# Patient Record
Sex: Male | Born: 1950 | Race: White | Hispanic: No | Marital: Married | Smoking: Never smoker
Health system: Southern US, Community
[De-identification: ages and names within clinical notes are randomized; demographics above are authoritative.]

## PROBLEM LIST (undated history)

## (undated) DIAGNOSIS — E785 Hyperlipidemia, unspecified: Secondary | ICD-10-CM

## (undated) DIAGNOSIS — I5189 Other ill-defined heart diseases: Secondary | ICD-10-CM

## (undated) DIAGNOSIS — I071 Rheumatic tricuspid insufficiency: Secondary | ICD-10-CM

## (undated) DIAGNOSIS — I639 Cerebral infarction, unspecified: Secondary | ICD-10-CM

## (undated) DIAGNOSIS — I10 Essential (primary) hypertension: Secondary | ICD-10-CM

## (undated) DIAGNOSIS — F0390 Unspecified dementia without behavioral disturbance: Secondary | ICD-10-CM

## (undated) DIAGNOSIS — K219 Gastro-esophageal reflux disease without esophagitis: Secondary | ICD-10-CM

## (undated) DIAGNOSIS — G459 Transient cerebral ischemic attack, unspecified: Secondary | ICD-10-CM

## (undated) HISTORY — DX: Transient cerebral ischemic attack, unspecified: G45.9

## (undated) HISTORY — PX: COLONOSCOPY: SHX174

## (undated) HISTORY — DX: Hyperlipidemia, unspecified: E78.5

---

## 1980-10-27 HISTORY — PX: RETINAL DETACHMENT SURGERY: SHX105

## 2005-04-10 ENCOUNTER — Emergency Department: Payer: Self-pay | Admitting: Unknown Physician Specialty

## 2005-04-10 ENCOUNTER — Other Ambulatory Visit: Payer: Self-pay

## 2006-06-09 ENCOUNTER — Emergency Department: Payer: Self-pay | Admitting: Emergency Medicine

## 2006-06-09 ENCOUNTER — Other Ambulatory Visit: Payer: Self-pay

## 2006-06-10 ENCOUNTER — Ambulatory Visit: Payer: Self-pay | Admitting: Emergency Medicine

## 2007-11-27 ENCOUNTER — Emergency Department: Payer: Self-pay | Admitting: Emergency Medicine

## 2011-08-17 ENCOUNTER — Emergency Department: Payer: Self-pay | Admitting: Unknown Physician Specialty

## 2012-02-24 DIAGNOSIS — I10 Essential (primary) hypertension: Secondary | ICD-10-CM | POA: Insufficient documentation

## 2012-12-18 ENCOUNTER — Emergency Department: Payer: Self-pay | Admitting: Internal Medicine

## 2013-06-15 ENCOUNTER — Ambulatory Visit: Payer: Self-pay | Admitting: Unknown Physician Specialty

## 2015-05-16 DIAGNOSIS — Z8673 Personal history of transient ischemic attack (TIA), and cerebral infarction without residual deficits: Secondary | ICD-10-CM | POA: Insufficient documentation

## 2015-10-29 DIAGNOSIS — J0100 Acute maxillary sinusitis, unspecified: Secondary | ICD-10-CM | POA: Diagnosis not present

## 2016-05-05 DIAGNOSIS — C4441 Basal cell carcinoma of skin of scalp and neck: Secondary | ICD-10-CM | POA: Diagnosis not present

## 2016-05-05 DIAGNOSIS — D0439 Carcinoma in situ of skin of other parts of face: Secondary | ICD-10-CM | POA: Diagnosis not present

## 2016-05-05 DIAGNOSIS — D485 Neoplasm of uncertain behavior of skin: Secondary | ICD-10-CM | POA: Diagnosis not present

## 2016-05-05 DIAGNOSIS — D1801 Hemangioma of skin and subcutaneous tissue: Secondary | ICD-10-CM | POA: Diagnosis not present

## 2016-05-05 DIAGNOSIS — X32XXXA Exposure to sunlight, initial encounter: Secondary | ICD-10-CM | POA: Diagnosis not present

## 2016-05-05 DIAGNOSIS — C44311 Basal cell carcinoma of skin of nose: Secondary | ICD-10-CM | POA: Diagnosis not present

## 2016-05-05 DIAGNOSIS — L570 Actinic keratosis: Secondary | ICD-10-CM | POA: Diagnosis not present

## 2016-05-05 DIAGNOSIS — L80 Vitiligo: Secondary | ICD-10-CM | POA: Diagnosis not present

## 2016-05-16 DIAGNOSIS — L80 Vitiligo: Secondary | ICD-10-CM | POA: Diagnosis not present

## 2016-05-16 DIAGNOSIS — I10 Essential (primary) hypertension: Secondary | ICD-10-CM | POA: Diagnosis not present

## 2016-05-16 DIAGNOSIS — Z0000 Encounter for general adult medical examination without abnormal findings: Secondary | ICD-10-CM | POA: Insufficient documentation

## 2016-05-16 DIAGNOSIS — Z8673 Personal history of transient ischemic attack (TIA), and cerebral infarction without residual deficits: Secondary | ICD-10-CM | POA: Diagnosis not present

## 2016-05-19 DIAGNOSIS — Z125 Encounter for screening for malignant neoplasm of prostate: Secondary | ICD-10-CM | POA: Diagnosis not present

## 2016-05-19 DIAGNOSIS — I10 Essential (primary) hypertension: Secondary | ICD-10-CM | POA: Diagnosis not present

## 2016-05-19 DIAGNOSIS — L80 Vitiligo: Secondary | ICD-10-CM | POA: Diagnosis not present

## 2016-05-19 DIAGNOSIS — Z0000 Encounter for general adult medical examination without abnormal findings: Secondary | ICD-10-CM | POA: Diagnosis not present

## 2016-05-23 DIAGNOSIS — L905 Scar conditions and fibrosis of skin: Secondary | ICD-10-CM | POA: Diagnosis not present

## 2016-05-23 DIAGNOSIS — C4441 Basal cell carcinoma of skin of scalp and neck: Secondary | ICD-10-CM | POA: Diagnosis not present

## 2016-05-28 DIAGNOSIS — C44311 Basal cell carcinoma of skin of nose: Secondary | ICD-10-CM | POA: Diagnosis not present

## 2016-05-28 DIAGNOSIS — Z85828 Personal history of other malignant neoplasm of skin: Secondary | ICD-10-CM | POA: Diagnosis not present

## 2016-06-06 DIAGNOSIS — D044 Carcinoma in situ of skin of scalp and neck: Secondary | ICD-10-CM | POA: Diagnosis not present

## 2016-06-19 ENCOUNTER — Emergency Department
Admission: EM | Admit: 2016-06-19 | Discharge: 2016-06-19 | Disposition: A | Discharge status: Home / Self Care | Payer: PPO | Attending: Emergency Medicine | Admitting: Emergency Medicine

## 2016-06-19 ENCOUNTER — Encounter: Payer: Self-pay | Admitting: Emergency Medicine

## 2016-06-19 DIAGNOSIS — R1031 Right lower quadrant pain: Secondary | ICD-10-CM | POA: Diagnosis not present

## 2016-06-19 DIAGNOSIS — K4090 Unilateral inguinal hernia, without obstruction or gangrene, not specified as recurrent: Secondary | ICD-10-CM | POA: Diagnosis not present

## 2016-06-19 LAB — COMPREHENSIVE METABOLIC PANEL
ALK PHOS: 63 U/L (ref 38–126)
ALT: 26 U/L (ref 17–63)
AST: 25 U/L (ref 15–41)
Albumin: 4.7 g/dL (ref 3.5–5.0)
Anion gap: 9 (ref 5–15)
BILIRUBIN TOTAL: 1 mg/dL (ref 0.3–1.2)
BUN: 23 mg/dL — ABNORMAL HIGH (ref 6–20)
CALCIUM: 9.6 mg/dL (ref 8.9–10.3)
CO2: 21 mmol/L — ABNORMAL LOW (ref 22–32)
CREATININE: 1.11 mg/dL (ref 0.61–1.24)
Chloride: 111 mmol/L (ref 101–111)
Glucose, Bld: 87 mg/dL (ref 65–99)
Potassium: 4.4 mmol/L (ref 3.5–5.1)
Sodium: 141 mmol/L (ref 135–145)
TOTAL PROTEIN: 7.3 g/dL (ref 6.5–8.1)

## 2016-06-19 LAB — CBC
HCT: 44.5 % (ref 40.0–52.0)
Hemoglobin: 15.8 g/dL (ref 13.0–18.0)
MCH: 33.1 pg (ref 26.0–34.0)
MCHC: 35.6 g/dL (ref 32.0–36.0)
MCV: 93 fL (ref 80.0–100.0)
PLATELETS: 196 10*3/uL (ref 150–440)
RBC: 4.78 MIL/uL (ref 4.40–5.90)
RDW: 13.1 % (ref 11.5–14.5)
WBC: 4.8 10*3/uL (ref 3.8–10.6)

## 2016-06-19 LAB — URINALYSIS COMPLETE WITH MICROSCOPIC (ARMC ONLY)
Bacteria, UA: NONE SEEN
Bilirubin Urine: NEGATIVE
Glucose, UA: NEGATIVE mg/dL
Hgb urine dipstick: NEGATIVE
KETONES UR: NEGATIVE mg/dL
Leukocytes, UA: NEGATIVE
NITRITE: NEGATIVE
PROTEIN: NEGATIVE mg/dL
RBC / HPF: NONE SEEN RBC/hpf (ref 0–5)
SPECIFIC GRAVITY, URINE: 1.021 (ref 1.005–1.030)
Squamous Epithelial / LPF: NONE SEEN
pH: 5 (ref 5.0–8.0)

## 2016-06-19 LAB — LIPASE, BLOOD: LIPASE: 22 U/L (ref 11–51)

## 2016-06-19 MED ORDER — TRAMADOL HCL 50 MG PO TABS: 50.0000 mg | ORAL_TABLET | Freq: Four times a day (QID) | ORAL | 0 refills | Status: DC | PRN

## 2016-06-19 MED ORDER — TRAMADOL HCL 50 MG PO TABS
50.0000 mg | ORAL_TABLET | Freq: Once | ORAL | Status: AC
  Administered 2016-06-19: 50 mg via ORAL
  Filled 2016-06-19: qty 1

## 2016-06-19 NOTE — ED Triage Notes (Signed)
Pt presents from home with RLQ pain for about 3 weeks. States that today it hurts worse, with a burning quality. Denies urinary symptoms, fever, chills. Pt states pain usually goes away when he sits down, but today it is not going away.

## 2016-06-19 NOTE — ED Notes (Signed)
Lab called concerning urine sample, pt states he gave a sample in triage, lab states they will look for sample

## 2016-06-19 NOTE — ED Notes (Signed)
EDP at bedside  

## 2016-06-19 NOTE — Discharge Instructions (Signed)
You have small inguinal hernia.   Please take motrin for pain.   Take tramadol for severe pain.   No heavy lifting  Call [facility] for appointment   Return to ER if you have severe abdominal pain, fever, vomiting, not passing gas.

## 2016-06-19 NOTE — ED Provider Notes (Signed)
[facility] Provider Note   CSN: [id number] Arrival date & time: 06/19/16  1654     History   Chief Complaint Chief Complaint  Patient presents with  . Abdominal Pain    HPI Todd Williamson is a 65 y.o. male here presenting with right-sided abdominal pain. Patient states that he has been having intermittent right inguinal area pain for the last 3 weeks. Patient states that it's worse when he exerts himself and carried something heavy. States that it's better when he sits down but he does feel more pain when he stands up. Denies any fever or vomiting or urinary symptoms. Rates that he is passing gas and had normal bowel movement. Denies any previous abdominal surgeries.  The history is provided by the patient.    History reviewed. No pertinent past medical history.  There are no active problems to display for this patient.   History reviewed. No pertinent surgical history.     Home Medications    Prior to Admission medications   Not on File    Family History History reviewed. No pertinent family history.  Social History Social History  Substance Use Topics  . Smoking status: Never Smoker  . Smokeless tobacco: Never Used  . Alcohol use Yes     Comment: occasionally     Allergies   Review of patient's allergies indicates no known allergies.   Review of Systems Review of Systems  Gastrointestinal: Positive for abdominal pain.  All other systems reviewed and are negative.    Physical Exam Updated Vital Signs BP 135/82 (BP Location: Right Arm)   Pulse 65   Temp 97.7 F (36.5 C) (Oral)   Resp 18   Ht 6\' 1"  (1.854 m)   Wt 195 lb (88.5 kg)   SpO2 95%   BMI 25.73 kg/m   Physical Exam  Constitutional: He is oriented to person, place, and time. He appears well-developed and well-nourished.  HENT:  Head: Normocephalic.  Eyes: Pupils are equal, round, and reactive to light.  Neck: Normal range of motion.  Cardiovascular: Normal rate,  regular rhythm and normal heart sounds.   Pulmonary/Chest: Effort normal and breath sounds normal.  Abdominal: Soft. Bowel sounds are normal.  ? Small indirect inguinal hernia when coughing, no signs of incarceration. Easily reducible   Genitourinary:  Genitourinary Comments: No testicular tenderness, good cremasteric reflex   Musculoskeletal: Normal range of motion.  Neurological: He is alert and oriented to person, place, and time.  Skin: Skin is warm.  Nursing note and vitals reviewed.    ED Treatments / Results  Labs (all labs ordered are listed, but only abnormal results are displayed) Labs Reviewed  COMPREHENSIVE METABOLIC PANEL - Abnormal; Notable for the following:       Result Value   CO2 21 (*)    BUN 23 (*)    All other components within normal limits  URINALYSIS COMPLETEWITH MICROSCOPIC ([facility] ONLY) - Abnormal; Notable for the following:    Color, Urine YELLOW (*)    APPearance CLEAR (*)    All other components within normal limits  LIPASE, BLOOD  CBC    EKG  EKG Interpretation None       Radiology No results found.  Procedures Procedures (including critical care time)  Medications Ordered in ED Medications - No data to display   Initial Impression / Assessment and Plan / ED Course  I have reviewed the triage vital signs and the nursing notes.  Pertinent labs & imaging results that  were available during my care of the patient were reviewed by me and considered in my medical decision making (see chart for details).  Clinical Course   Todd Williamson is a 65 y.o. male here with small R inguinal hernia. Easily reducible. Labs unremarkable. I called Dr. Burt Knack from surgery, who wants patient to call office for appointment. Will dc home with tramadol, motrin    Final Clinical Impressions(s) / ED Diagnoses   Final diagnoses:  None    New Prescriptions New Prescriptions   No medications on file     Drenda Freeze, MD 06/19/16 1912

## 2016-06-26 ENCOUNTER — Other Ambulatory Visit: Payer: Self-pay

## 2016-06-27 ENCOUNTER — Telehealth: Payer: Self-pay | Admitting: Surgery

## 2016-06-27 NOTE — Telephone Encounter (Signed)
Spoke with patient at this time. He stated he was in a lot of pain yesterday however today it hasn't been that bad today.  He denies fever, chills, pain or diarrhea or constipation at this time. He was instructed that if any of the above develop to go to the emergency room, otherwise we will see him 07/02/16.

## 2016-06-27 NOTE — Telephone Encounter (Signed)
Patient was seen in the ER on 06/19/16 for inguinal hernia and consulted with Dr Burt Knack in the ER. An appointment was made for 07/07/16 with Dr Dahlia Byes. Patient is calling today because he is in a lot of pain. Please triage to determine if patient should return to the ER, and/or should patient see Dr Burt Knack this coming week on 07/02/16, or is it ok to wait until 9/11? Thank you.

## 2016-07-07 ENCOUNTER — Ambulatory Visit (INDEPENDENT_AMBULATORY_CARE_PROVIDER_SITE_OTHER): Payer: PPO | Admitting: Surgery

## 2016-07-07 ENCOUNTER — Encounter: Payer: Self-pay | Admitting: Surgery

## 2016-07-07 VITALS — BP 145/93 | HR 65 | Temp 98.0°F | Ht 73.0 in | Wt 200.0 lb

## 2016-07-07 DIAGNOSIS — K4090 Unilateral inguinal hernia, without obstruction or gangrene, not specified as recurrent: Secondary | ICD-10-CM

## 2016-07-07 DIAGNOSIS — G459 Transient cerebral ischemic attack, unspecified: Secondary | ICD-10-CM | POA: Insufficient documentation

## 2016-07-07 NOTE — Progress Notes (Signed)
We have scheduled your surgery for 07/24/16 at [facility]. Please see your [facility] pre-care sheet. Please call our office if you have any questions or concerns.

## 2016-07-07 NOTE — Progress Notes (Signed)
Patient ID: Todd Williamson, male   DOB: 10/05/1951, 65 y.o.   MRN: [id number]  HPI Todd Williamson is a 65 y.o. male obtained for a right inguinal hernia. He reports that he first noticed some intermittent pain on the right groin about 2 weeks ago. Patient reports that his pain is sharp, intermittent and non-radiated. He denies any previous hernia repairs. 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 He does report that his symptoms exacerbate and he does any Valsalva maneuvers. He takes daily aspirin. CBC as well as CMP were normal  HPI  Past Medical History:  Diagnosis Date  . TIA (transient ischemic attack)     Past Surgical History:  Procedure Laterality Date  . RETINAL DETACHMENT SURGERY Right 1982    Family History  Problem Relation Age of Onset  . Stroke Mother   . Heart disease Father   . Prostate cancer Brother   . Bone cancer Brother   . Heart disease Brother     Social History Social History  Substance Use Topics  . Smoking status: Never Smoker  . Smokeless tobacco: Never Used  . Alcohol use Yes     Comment: occasionally    Allergies  Allergen Reactions  . Hydrochlorothiazide Nausea And Vomiting    Current Outpatient Prescriptions  Medication Sig Dispense Refill  . amLODipine-benazepril (LOTREL) 10-20 MG capsule Take by mouth.    Marland Kitchen aspirin EC 325 MG tablet Take by mouth.    Marland Kitchen azelastine (ASTELIN) 0.1 % nasal spray Place into the nose.     No current facility-administered medications for this visit.      Review of Systems A 10 point review of systems was asked and was negative except for the information on the HPI  Physical Exam Blood pressure (!) 145/93, pulse 65, temperature 98 F (36.7 C), temperature source Oral, height 6\' 1"  (1.854 m), weight 90.7 kg (200 lb). CONSTITUTIONAL: NAD EYES: Pupils are equal, round, and reactive to light, Sclera are non-icteric. EARS, NOSE, MOUTH AND  THROAT: The oropharynx is clear. The oral mucosa is pink and moist. Hearing is intact to voice. LYMPH NODES:  Lymph nodes in the neck are normal. RESPIRATORY:  Lungs are clear. There is normal respiratory effort, with equal breath sounds bilaterally, and without pathologic use of accessory muscles. CARDIOVASCULAR: Heart is regular without murmurs, gallops, or rubs. GI: The abdomen is  soft, nontender, and nondistended. There are no palpable masses.  There is a reducible right inguinal hernia.  GU: No testicular masses or tenderness, no penile lesions.  MUSCULOSKELETAL: Normal muscle strength and tone. No cyanosis or edema.   SKIN: Turgor is good and there are no pathologic skin lesions or ulcers. NEUROLOGIC: Motor and sensation is grossly normal. Cranial nerves are grossly intact. PSYCH:  Oriented to person, place and time. Affect is normal.  Data Reviewed I have personally reviewed the patient's imaging, laboratory findings and medical records.    Assessment/ Plan Symptomatic right inguinal hernia. Discussed with the patient in detail given his symptoms I do recommend repair. Discussed with him in detail about options of open versus laparoscopic. He wishes to have a laparoscopic repair I have explained the procedure, risks, and aftercare of inguinal hernia repair to [employer].   Risks include but are not limited to bleeding, infection, wound problems, anesthesia, recurrence, bladder or intestine injury, urinary retention, testicular dysfunction, chronic pain, mesh problems.  He  seems to understand and agrees to proceed.  Questions were answered to his stated satisfaction. We'll plan for laparoscopic right inguinal hernia repair in a couple weeks   Caroleen Hamman, MD FACS General Surgeon 07/07/2016, 3:22 PM

## 2016-07-09 ENCOUNTER — Telehealth: Payer: Self-pay | Admitting: Surgery

## 2016-07-09 NOTE — Telephone Encounter (Signed)
I have called patient, no answer--to go over sx information below. Please document if patient calls back and is given information.    Sx date: 07/24/16 With Dr Pabon--Laparoscopic Right Inguinal Hernia repair.  Pre op date: 07/14/16 at 11:30am--Office.   Patient made aware to call [phone], between 1-3:00pm the day before surgery, to find out what time to arrive.

## 2016-07-13 DIAGNOSIS — H9203 Otalgia, bilateral: Secondary | ICD-10-CM | POA: Diagnosis not present

## 2016-07-13 DIAGNOSIS — J309 Allergic rhinitis, unspecified: Secondary | ICD-10-CM | POA: Diagnosis not present

## 2016-07-14 ENCOUNTER — Encounter
Admission: RE | Admit: 2016-07-14 | Discharge: 2016-07-14 | Disposition: A | Discharge status: Home / Self Care | Payer: PPO | Source: Ambulatory Visit | Attending: Surgery | Admitting: Surgery

## 2016-07-14 DIAGNOSIS — I10 Essential (primary) hypertension: Secondary | ICD-10-CM | POA: Insufficient documentation

## 2016-07-14 DIAGNOSIS — Z01818 Encounter for other preprocedural examination: Secondary | ICD-10-CM | POA: Insufficient documentation

## 2016-07-14 HISTORY — DX: Essential (primary) hypertension: I10

## 2016-07-14 HISTORY — DX: Gastro-esophageal reflux disease without esophagitis: K21.9

## 2016-07-14 NOTE — Patient Instructions (Signed)
Your procedure is scheduled on: Thursday 07/24/16 Report to Day Surgery. 2ND FLOOR MEDICAL MALL ENTRANCE To find out your arrival time please call [phone] between 1PM - 3PM on Wednesday 07/23/16.  Remember: Instructions that are not followed completely may result in serious medical risk, up to and including death, or upon the discretion of your surgeon and anesthesiologist your surgery may need to be rescheduled.    __X__ 1. Do not eat food or drink liquids after midnight. No gum chewing or hard candies.     __X__ 2. No Alcohol for 24 hours before or after surgery.   ____ 3. Bring all medications with you on the day of surgery if instructed.    __X__ 4. Notify your doctor if there is any change in your medical condition     (cold, fever, infections).     Do not wear jewelry, make-up, hairpins, clips or nail polish.  Do not wear lotions, powders, or perfumes.   Do not shave 48 hours prior to surgery. Men may shave face and neck.  Do not bring valuables to the hospital.    [facility] is not responsible for any belongings or valuables.               Contacts, dentures or bridgework may not be worn into surgery.  Leave your suitcase in the car. After surgery it may be brought to your room.  For patients admitted to the hospital, discharge time is determined by your                treatment team.   Patients discharged the day of surgery will not be allowed to drive home.   Please read over the following fact sheets that you were given:   Surgical Site Infection Prevention   __X__ Take these medicines the morning of surgery with A SIP OF WATER:    1. AMLODIPINE/BENAZEPRIL  2. OMEPRAZOLE  3.   4.  5.  6.  ____ Fleet Enema (as directed)   __X__ Use CHG Soap as directed  ____ Use inhalers on the day of surgery  ____ Stop metformin 2 days prior to surgery    ____ Take 1/2 of usual insulin dose the night before surgery and none on the morning of surgery.   __X__ Stop  Coumadin/Plavix/aspirin on 07/19/16  ____ Stop Anti-inflammatories on    ____ Stop supplements until after surgery.    ____ Bring C-Pap to the hospital.

## 2016-07-14 NOTE — Telephone Encounter (Signed)
Patient has received all surgery information in previous note. Patient understands all information.

## 2016-07-24 ENCOUNTER — Encounter: Admission: RE | Payer: Self-pay | Source: Ambulatory Visit

## 2016-07-24 ENCOUNTER — Ambulatory Visit: Admission: RE | Admit: 2016-07-24 | Payer: PPO | Source: Ambulatory Visit | Admitting: Surgery

## 2016-07-24 SURGERY — REPAIR, HERNIA, INGUINAL, LAPAROSCOPIC
Anesthesia: General | Laterality: Right

## 2016-07-25 ENCOUNTER — Telehealth: Payer: Self-pay | Admitting: Surgery

## 2016-07-25 NOTE — Telephone Encounter (Signed)
Patient's wife has called and stated that the patient is doing well with the sling he was provided for his hernia. She states that he did not want to reschedule surgery at this time due to his work. This is the busy season for work for the patient. She states that he does eventually want to reschedule later at the end of the year. I have advised her that the patient will require a follow up back in the office prior to scheduling surgery and she was very understanding of this process.

## 2016-09-08 DIAGNOSIS — L821 Other seborrheic keratosis: Secondary | ICD-10-CM | POA: Diagnosis not present

## 2016-09-08 DIAGNOSIS — Z85828 Personal history of other malignant neoplasm of skin: Secondary | ICD-10-CM | POA: Diagnosis not present

## 2016-09-08 DIAGNOSIS — Z08 Encounter for follow-up examination after completed treatment for malignant neoplasm: Secondary | ICD-10-CM | POA: Diagnosis not present

## 2016-09-08 DIAGNOSIS — D1801 Hemangioma of skin and subcutaneous tissue: Secondary | ICD-10-CM | POA: Diagnosis not present

## 2016-10-27 DIAGNOSIS — G459 Transient cerebral ischemic attack, unspecified: Secondary | ICD-10-CM

## 2016-10-27 HISTORY — DX: Transient cerebral ischemic attack, unspecified: G45.9

## 2016-12-12 ENCOUNTER — Other Ambulatory Visit: Payer: Self-pay | Admitting: Physician Assistant

## 2016-12-12 ENCOUNTER — Ambulatory Visit
Admission: RE | Admit: 2016-12-12 | Discharge: 2016-12-12 | Disposition: A | Discharge status: Home / Self Care | Payer: PPO | Source: Ambulatory Visit | Attending: Physician Assistant | Admitting: Physician Assistant

## 2016-12-12 DIAGNOSIS — G459 Transient cerebral ischemic attack, unspecified: Secondary | ICD-10-CM | POA: Insufficient documentation

## 2016-12-12 DIAGNOSIS — R202 Paresthesia of skin: Secondary | ICD-10-CM | POA: Diagnosis not present

## 2016-12-12 DIAGNOSIS — R531 Weakness: Secondary | ICD-10-CM | POA: Diagnosis not present

## 2016-12-12 DIAGNOSIS — R42 Dizziness and giddiness: Secondary | ICD-10-CM | POA: Diagnosis not present

## 2016-12-12 DIAGNOSIS — R51 Headache: Secondary | ICD-10-CM | POA: Diagnosis not present

## 2016-12-12 MED ORDER — IOPAMIDOL (ISOVUE-370) INJECTION 76%
75.0000 mL | Freq: Once | INTRAVENOUS | Status: AC | PRN
  Administered 2016-12-12: 75 mL via INTRAVENOUS

## 2017-01-02 DIAGNOSIS — G459 Transient cerebral ischemic attack, unspecified: Secondary | ICD-10-CM | POA: Diagnosis not present

## 2017-01-02 DIAGNOSIS — I6523 Occlusion and stenosis of bilateral carotid arteries: Secondary | ICD-10-CM | POA: Diagnosis not present

## 2017-01-09 DIAGNOSIS — I10 Essential (primary) hypertension: Secondary | ICD-10-CM | POA: Diagnosis not present

## 2017-01-09 DIAGNOSIS — R42 Dizziness and giddiness: Secondary | ICD-10-CM | POA: Diagnosis not present

## 2017-01-09 DIAGNOSIS — Z8673 Personal history of transient ischemic attack (TIA), and cerebral infarction without residual deficits: Secondary | ICD-10-CM | POA: Diagnosis not present

## 2017-01-27 DIAGNOSIS — R42 Dizziness and giddiness: Secondary | ICD-10-CM | POA: Diagnosis not present

## 2017-01-27 DIAGNOSIS — G458 Other transient cerebral ischemic attacks and related syndromes: Secondary | ICD-10-CM | POA: Diagnosis not present

## 2017-01-28 DIAGNOSIS — R42 Dizziness and giddiness: Secondary | ICD-10-CM | POA: Diagnosis not present

## 2017-03-14 DIAGNOSIS — R29818 Other symptoms and signs involving the nervous system: Secondary | ICD-10-CM | POA: Diagnosis not present

## 2017-04-15 DIAGNOSIS — Z8673 Personal history of transient ischemic attack (TIA), and cerebral infarction without residual deficits: Secondary | ICD-10-CM | POA: Diagnosis not present

## 2017-04-15 DIAGNOSIS — I10 Essential (primary) hypertension: Secondary | ICD-10-CM | POA: Diagnosis not present

## 2017-05-04 DIAGNOSIS — Z8673 Personal history of transient ischemic attack (TIA), and cerebral infarction without residual deficits: Secondary | ICD-10-CM | POA: Diagnosis not present

## 2017-05-11 DIAGNOSIS — Z85828 Personal history of other malignant neoplasm of skin: Secondary | ICD-10-CM | POA: Diagnosis not present

## 2017-05-11 DIAGNOSIS — L570 Actinic keratosis: Secondary | ICD-10-CM | POA: Diagnosis not present

## 2017-05-11 DIAGNOSIS — L80 Vitiligo: Secondary | ICD-10-CM | POA: Diagnosis not present

## 2017-05-11 DIAGNOSIS — D2262 Melanocytic nevi of left upper limb, including shoulder: Secondary | ICD-10-CM | POA: Diagnosis not present

## 2017-05-11 DIAGNOSIS — D225 Melanocytic nevi of trunk: Secondary | ICD-10-CM | POA: Diagnosis not present

## 2017-05-11 DIAGNOSIS — X32XXXA Exposure to sunlight, initial encounter: Secondary | ICD-10-CM | POA: Diagnosis not present

## 2017-05-19 DIAGNOSIS — I10 Essential (primary) hypertension: Secondary | ICD-10-CM | POA: Diagnosis not present

## 2017-05-19 DIAGNOSIS — G459 Transient cerebral ischemic attack, unspecified: Secondary | ICD-10-CM | POA: Diagnosis not present

## 2017-05-19 DIAGNOSIS — Z0000 Encounter for general adult medical examination without abnormal findings: Secondary | ICD-10-CM | POA: Diagnosis not present

## 2017-05-19 DIAGNOSIS — Z8673 Personal history of transient ischemic attack (TIA), and cerebral infarction without residual deficits: Secondary | ICD-10-CM | POA: Diagnosis not present

## 2017-11-04 DIAGNOSIS — Z8673 Personal history of transient ischemic attack (TIA), and cerebral infarction without residual deficits: Secondary | ICD-10-CM | POA: Diagnosis not present

## 2017-11-10 DIAGNOSIS — I10 Essential (primary) hypertension: Secondary | ICD-10-CM | POA: Diagnosis not present

## 2017-11-10 DIAGNOSIS — G459 Transient cerebral ischemic attack, unspecified: Secondary | ICD-10-CM | POA: Diagnosis not present

## 2017-11-10 DIAGNOSIS — Z8673 Personal history of transient ischemic attack (TIA), and cerebral infarction without residual deficits: Secondary | ICD-10-CM | POA: Diagnosis not present

## 2017-11-10 DIAGNOSIS — Z0000 Encounter for general adult medical examination without abnormal findings: Secondary | ICD-10-CM | POA: Diagnosis not present

## 2017-11-17 DIAGNOSIS — Z125 Encounter for screening for malignant neoplasm of prostate: Secondary | ICD-10-CM | POA: Diagnosis not present

## 2017-11-17 DIAGNOSIS — Z8673 Personal history of transient ischemic attack (TIA), and cerebral infarction without residual deficits: Secondary | ICD-10-CM | POA: Diagnosis not present

## 2017-11-17 DIAGNOSIS — I10 Essential (primary) hypertension: Secondary | ICD-10-CM | POA: Diagnosis not present

## 2017-11-17 DIAGNOSIS — E7800 Pure hypercholesterolemia, unspecified: Secondary | ICD-10-CM | POA: Diagnosis not present

## 2017-11-19 DIAGNOSIS — H2512 Age-related nuclear cataract, left eye: Secondary | ICD-10-CM | POA: Diagnosis not present

## 2018-01-11 IMAGING — CT CT ANGIO HEAD
3 of 11 series · 17 of 47 positions shown · IV contrast (APPLIED)
Comparison: MR brain 06/10/2006.

CLINICAL DATA: Weakness, dizziness, and headache 2 days ago. Severe
weakness in RIGHT leg. Denies injury and fall.

EXAM:
CT ANGIOGRAPHY HEAD
TECHNIQUE: Multidetector CT imaging of the head was performed using the
standard protocol during bolus administration of intravenous
contrast. Multiplanar CT image reconstructions and MIPs were
obtained to evaluate the vascular anatomy.
CONTRAST:  75 mL Isovue 370.

[Series 8: ax thin · axial · 0.39mm/px · z∈[-56,+85]mm · 12 of 165 slices shown]
[im 12/165  brain]
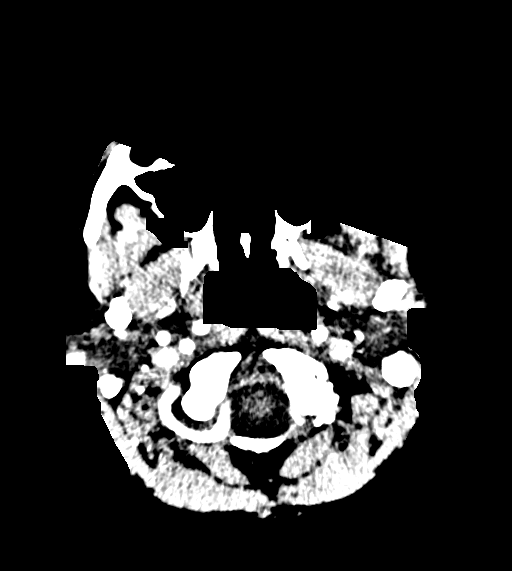
[im 24/165  bone]
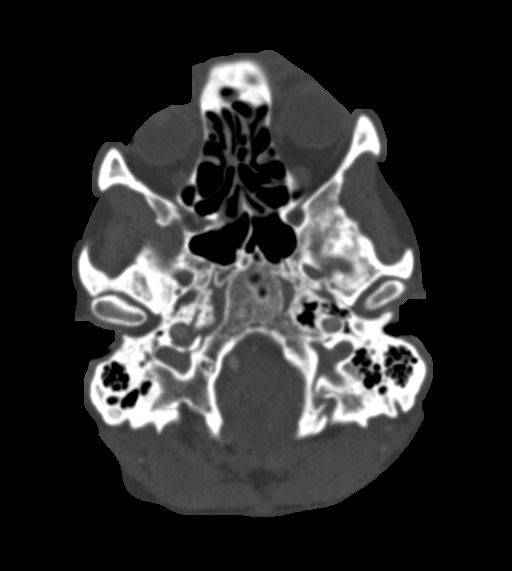
[im 36/165  brain]
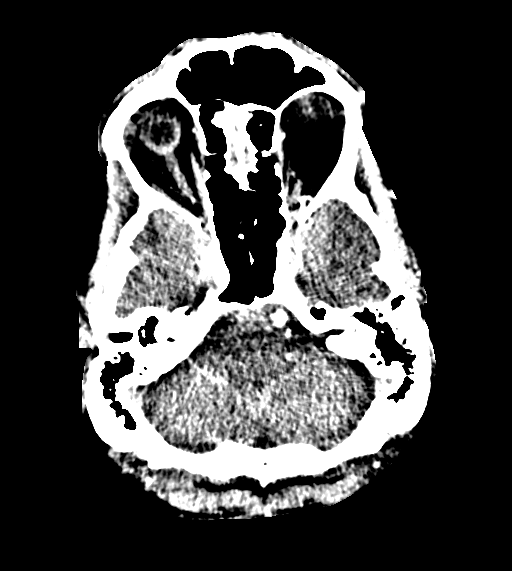
[im 47/165  bone]
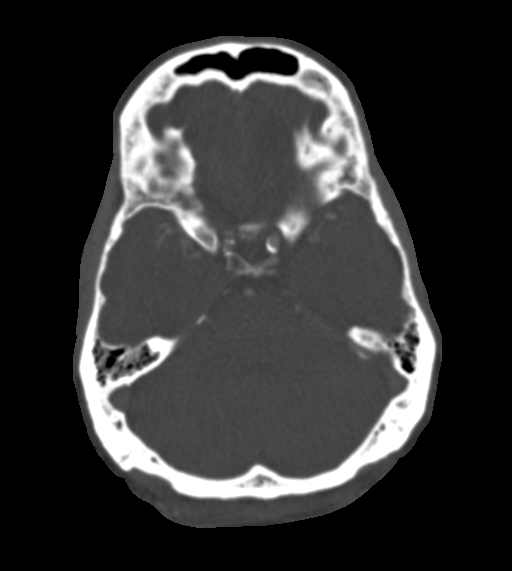
[im 59/165  brain]
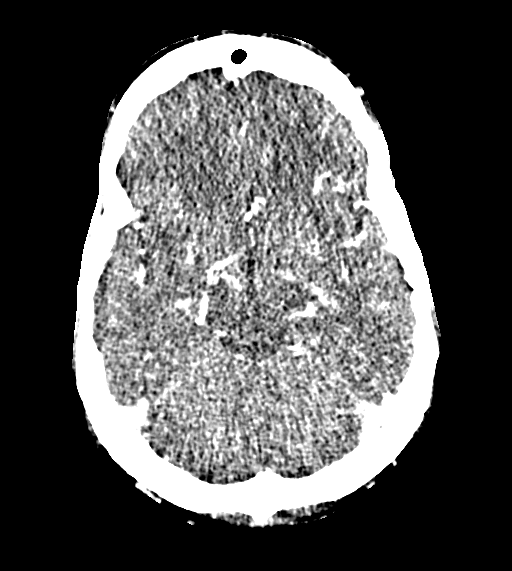
[im 71/165  bone]
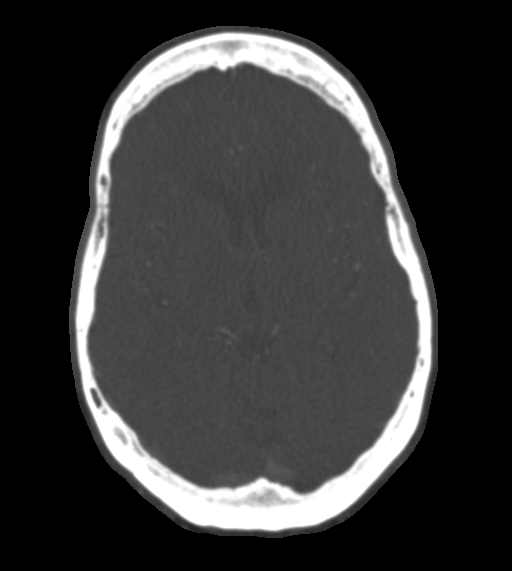
[im 94/165  brain]
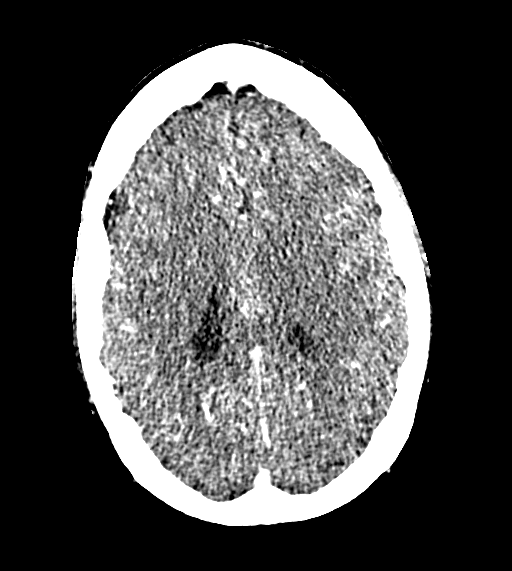
[im 106/165  bone]
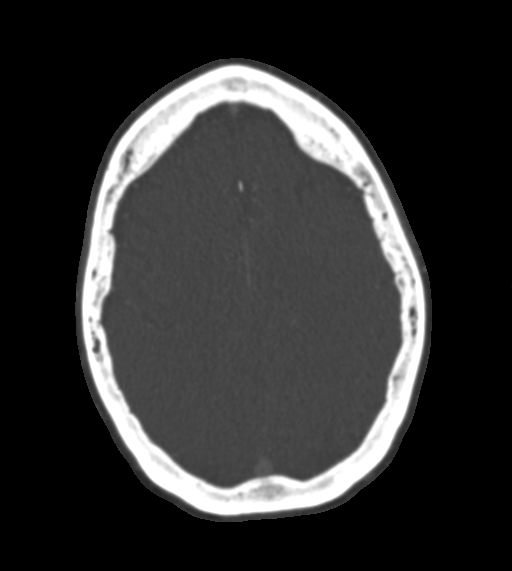
[im 118/165  brain]
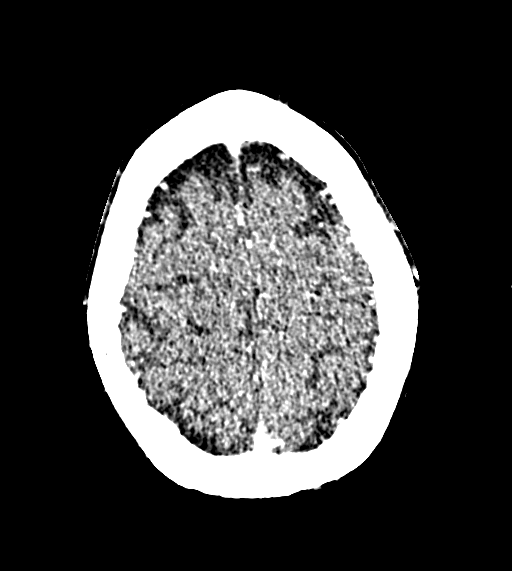
[im 129/165  bone]
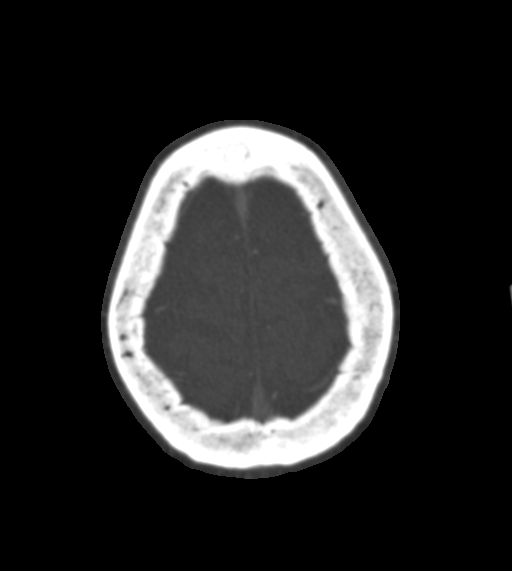
[im 141/165  brain]
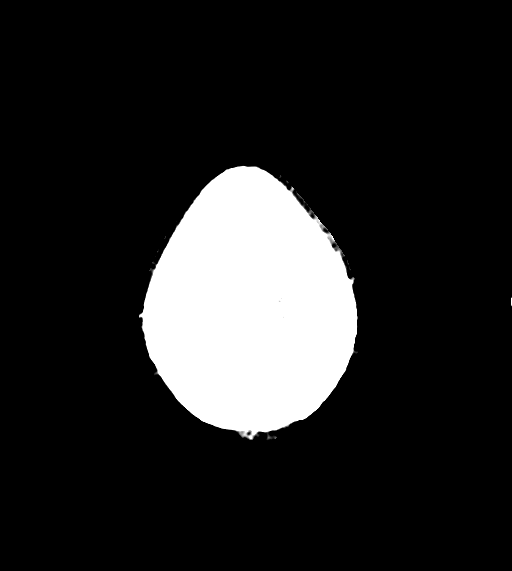
[im 153/165  bone]
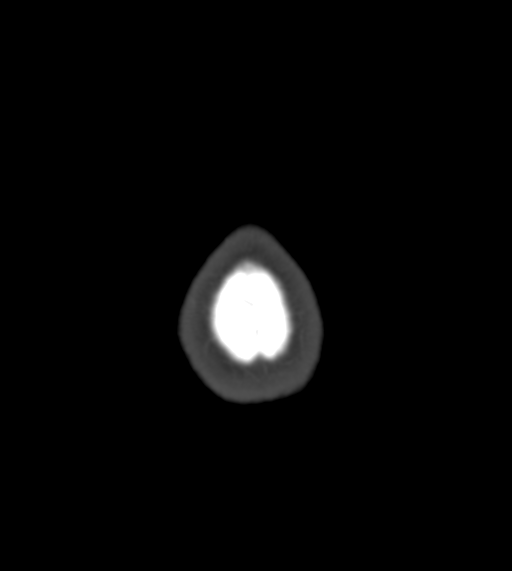

[Series 10: cor thin · coronal · 0.34mm/px · 3 of 221 slices shown]
[im 74/221  brain]
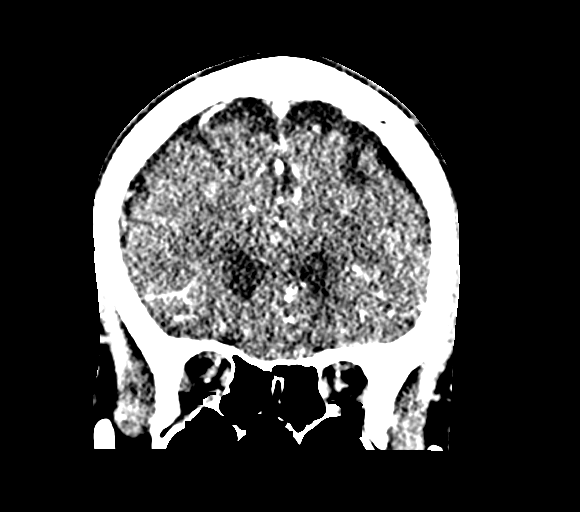
[im 111/221  brain]
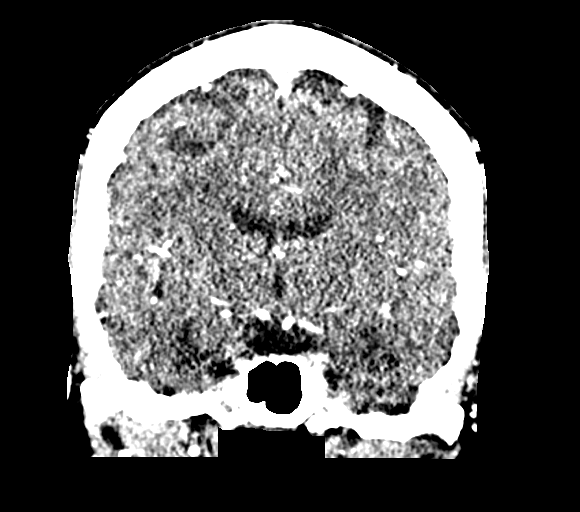
[im 147/221  brain]
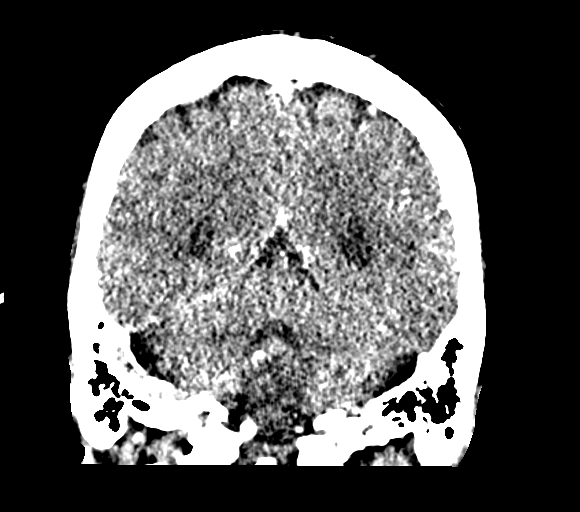

[Series 12: sag thin · sagittal · 0.37mm/px · 2 of 174 slices shown]
[im 58/174  brain]
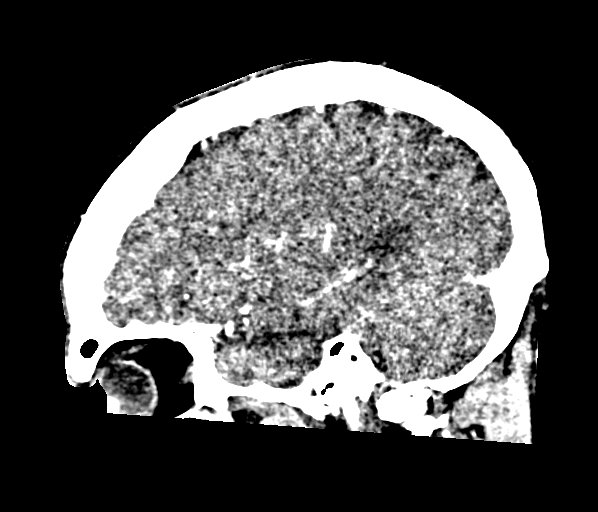
[im 116/174  brain]
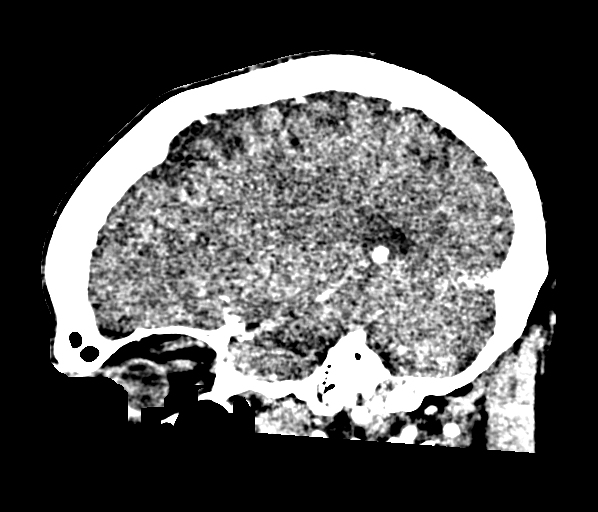

[17 of 47 positions shown; findings below may reference images not displayed]

FINDINGS: CT HEAD

Calvarium and skull base: No fracture or destructive lesion.
Mastoids and middle ears are clear.

Paranasal sinuses: Imaged portions are clear.

Orbits: Negative.

Brain: No evidence of acute abnormality, including acute infarct,
hemorrhage, hydrocephalus, or mass lesion.

CTA HEAD

Anterior circulation: Minor calcific atheromatous change in the
carotid siphons. No significant stenosis, proximal occlusion,
aneurysm, or vascular malformation.

Posterior circulation: RIGHT vertebral dominant. LEFT vertebral
primarily contributes to PICA. No significant stenosis, proximal
occlusion, aneurysm, or vascular malformation.

Venous sinuses: As permitted by contrast timing, patent.

Anatomic variants: Fetal origin LEFT PCA.

Delayed phase:   No abnormal intracranial enhancement.

Review of the MIP images confirms the above findings.
IMPRESSION: Minor atheromatous change. No proximal flow reducing lesion or acute
intracranial abnormality.

## 2018-01-18 DIAGNOSIS — S30861A Insect bite (nonvenomous) of abdominal wall, initial encounter: Secondary | ICD-10-CM | POA: Diagnosis not present

## 2018-01-18 DIAGNOSIS — W57XXXA Bitten or stung by nonvenomous insect and other nonvenomous arthropods, initial encounter: Secondary | ICD-10-CM | POA: Diagnosis not present

## 2018-05-10 DIAGNOSIS — Z85828 Personal history of other malignant neoplasm of skin: Secondary | ICD-10-CM | POA: Diagnosis not present

## 2018-05-10 DIAGNOSIS — D2261 Melanocytic nevi of right upper limb, including shoulder: Secondary | ICD-10-CM | POA: Diagnosis not present

## 2018-05-10 DIAGNOSIS — L80 Vitiligo: Secondary | ICD-10-CM | POA: Diagnosis not present

## 2018-05-10 DIAGNOSIS — L821 Other seborrheic keratosis: Secondary | ICD-10-CM | POA: Diagnosis not present

## 2018-05-10 DIAGNOSIS — D225 Melanocytic nevi of trunk: Secondary | ICD-10-CM | POA: Diagnosis not present

## 2018-05-10 DIAGNOSIS — Z08 Encounter for follow-up examination after completed treatment for malignant neoplasm: Secondary | ICD-10-CM | POA: Diagnosis not present

## 2018-05-10 DIAGNOSIS — D2272 Melanocytic nevi of left lower limb, including hip: Secondary | ICD-10-CM | POA: Diagnosis not present

## 2018-05-10 DIAGNOSIS — D2262 Melanocytic nevi of left upper limb, including shoulder: Secondary | ICD-10-CM | POA: Diagnosis not present

## 2018-05-10 DIAGNOSIS — D2271 Melanocytic nevi of right lower limb, including hip: Secondary | ICD-10-CM | POA: Diagnosis not present

## 2018-05-13 DIAGNOSIS — E7800 Pure hypercholesterolemia, unspecified: Secondary | ICD-10-CM | POA: Diagnosis not present

## 2018-05-13 DIAGNOSIS — Z125 Encounter for screening for malignant neoplasm of prostate: Secondary | ICD-10-CM | POA: Diagnosis not present

## 2018-05-13 DIAGNOSIS — R5383 Other fatigue: Secondary | ICD-10-CM | POA: Diagnosis not present

## 2018-05-13 DIAGNOSIS — I10 Essential (primary) hypertension: Secondary | ICD-10-CM | POA: Diagnosis not present

## 2018-05-20 DIAGNOSIS — E7800 Pure hypercholesterolemia, unspecified: Secondary | ICD-10-CM | POA: Diagnosis not present

## 2018-05-20 DIAGNOSIS — I10 Essential (primary) hypertension: Secondary | ICD-10-CM | POA: Diagnosis not present

## 2018-05-20 DIAGNOSIS — Z0000 Encounter for general adult medical examination without abnormal findings: Secondary | ICD-10-CM | POA: Diagnosis not present

## 2018-11-02 DIAGNOSIS — E7800 Pure hypercholesterolemia, unspecified: Secondary | ICD-10-CM | POA: Diagnosis not present

## 2018-11-02 DIAGNOSIS — I10 Essential (primary) hypertension: Secondary | ICD-10-CM | POA: Diagnosis not present

## 2018-11-04 DIAGNOSIS — Z8673 Personal history of transient ischemic attack (TIA), and cerebral infarction without residual deficits: Secondary | ICD-10-CM | POA: Diagnosis not present

## 2018-11-09 DIAGNOSIS — E7800 Pure hypercholesterolemia, unspecified: Secondary | ICD-10-CM | POA: Diagnosis not present

## 2018-11-09 DIAGNOSIS — K4090 Unilateral inguinal hernia, without obstruction or gangrene, not specified as recurrent: Secondary | ICD-10-CM | POA: Diagnosis not present

## 2018-11-09 DIAGNOSIS — Z8673 Personal history of transient ischemic attack (TIA), and cerebral infarction without residual deficits: Secondary | ICD-10-CM | POA: Diagnosis not present

## 2018-11-09 DIAGNOSIS — I10 Essential (primary) hypertension: Secondary | ICD-10-CM | POA: Diagnosis not present

## 2018-11-09 DIAGNOSIS — Z125 Encounter for screening for malignant neoplasm of prostate: Secondary | ICD-10-CM | POA: Diagnosis not present

## 2018-12-06 ENCOUNTER — Ambulatory Visit: Payer: PPO | Admitting: Surgery

## 2018-12-06 ENCOUNTER — Encounter: Payer: Self-pay | Admitting: *Deleted

## 2018-12-06 ENCOUNTER — Other Ambulatory Visit: Payer: Self-pay

## 2018-12-06 ENCOUNTER — Encounter: Payer: Self-pay | Admitting: Surgery

## 2018-12-06 ENCOUNTER — Telehealth: Payer: Self-pay | Admitting: *Deleted

## 2018-12-06 VITALS — BP 149/90 | HR 61 | Temp 97.8°F | Ht 73.0 in | Wt 190.0 lb

## 2018-12-06 DIAGNOSIS — K4091 Unilateral inguinal hernia, without obstruction or gangrene, recurrent: Secondary | ICD-10-CM | POA: Diagnosis not present

## 2018-12-06 NOTE — Progress Notes (Signed)
Patient ID: Todd Williamson, male   DOB: 20-Nov-1950, 68 y.o.   MRN: [id number]  HPI Todd Williamson is a 68 y.o. male unknown to me with a symptomatic right inguinal hernia.  He reports that his symptoms are becoming more pronounced.  Experiences intermittent pain after a long workday.  His pain is dull moderate intensity and located in the right inguinal area.  Denies any radiation of the pain.  The patient endorses that symptoms have becoming more frequent.  I saw him 2 years ago and he postpone his surgery.  In the interim he did develop a TIA and is currently on daily Plavix.  No new neurological symptoms.  No evidence of a strokes.  No fevers no chills no weight loss. I Did personally reviewed his CT scan of the head showing no major intracranial abnormalities.  He did have a normal CMP and a CBC that were unremarkable. Denies Easy bruising or bleeding.  HPI  Past Medical History:  Diagnosis Date  . GERD (gastroesophageal reflux disease)   . Hyperlipidemia   . Hypertension   . TIA (transient ischemic attack) 2018    Past Surgical History:  Procedure Laterality Date  . COLONOSCOPY    . RETINAL DETACHMENT SURGERY Right 1982    Family History  Problem Relation Age of Onset  . Stroke Mother   . Heart disease Father   . Prostate cancer Brother   . Bone cancer Brother   . Heart disease Brother     Social History Social History   Tobacco Use  . Smoking status: Never Smoker  . Smokeless tobacco: Never Used  Substance Use Topics  . Alcohol use: Yes    Comment: occasionally  . Drug use: No    Allergies  Allergen Reactions  . Hydrochlorothiazide Nausea And Vomiting    Current Outpatient Medications  Medication Sig Dispense Refill  . amLODipine-benazepril (LOTREL) 10-20 MG capsule Take 1 capsule by mouth daily.     Marland Kitchen atorvastatin (LIPITOR) 40 MG tablet Take 40 mg by mouth daily.    Marland Kitchen azelastine (ASTELIN) 0.1 % nasal spray Place 1 spray into both nostrils as needed.     .  clopidogrel (PLAVIX) 75 MG tablet Take 75 mg by mouth daily.    Marland Kitchen loratadine (CLARITIN) 10 MG tablet Take 10 mg by mouth daily.    Marland Kitchen omeprazole (PRILOSEC) 20 MG capsule Take 20 mg by mouth daily.     No current facility-administered medications for this visit.      Review of Systems Full ROS  was asked and was negative except for the information on the HPI  Physical Exam Blood pressure (!) 149/90, pulse 61, temperature 97.8 F (36.6 C), height 6\' 1"  (1.854 m), weight 190 lb (86.2 kg), SpO2 97 %. CONSTITUTIONAL: NAD EYES: Pupils are equal, round, and reactive to light, Sclera are non-icteric. EARS, NOSE, MOUTH AND THROAT: The oropharynx is clear. The oral mucosa is pink and moist. Hearing is intact to voice. LYMPH NODES:  Lymph nodes in the neck are normal. RESPIRATORY:  Lungs are clear. There is normal respiratory effort, with equal breath sounds bilaterally, and without pathologic use of accessory muscles. CARDIOVASCULAR: Heart is regular without murmurs, gallops, or rubs. GI: The abdomen is  soft, nontender, and nondistended. There are no palpable masses. There is no hepatosplenomegaly. There are normal bowel sounds in all quadrants. There is reducible Umbilical hernia and Reducible RIH. Mildly tender to palpation. GU: Rectal deferred.   MUSCULOSKELETAL: Normal muscle strength  and tone. No cyanosis or edema.   SKIN: Turgor is good and there are no pathologic skin lesions or ulcers. NEUROLOGIC: Motor and sensation is grossly normal. Cranial nerves are grossly intact. PSYCH:  Oriented to person, place and time. Affect is normal.  Data Reviewed  I have personally reviewed the patient's imaging, laboratory findings and medical records.    Assessment/Plan 68 year old with a symptomatic right inguinal hernia.  Discussed with the patient in detail about my recommendations for repair.  I do think he is a very good candidate for robotic repair.  Procedure discussed with the patient in  detail.  Risk, benefits and possible complications including but not limited to: Bleeding, infection, chronic pain, mesh problems.  He understands and wishes to proceed.  We also went over perioperative expectations. He needs to stop his Plavix 7 days prior to the operation, d/w the pt in detail. We Will repair the umbilical hernia during the same operative time.   Caroleen Hamman, MD FACS General Surgeon 12/06/2018, 10:38 AM

## 2018-12-06 NOTE — Progress Notes (Signed)
Patient's robotic RIH repair and umbilical hernia repair to be scheduled for 12-21-18 at [facility] with Dr. Dahlia Byes. Patient has been asked to stop Plavix seven days prior to procedure.  The patient is aware he will need to Pre-Admit. Patient will check in at the [place], [address] (first floor). Patient will be contacted with date and time once arranged.  The patient is aware to call the office should he have further questions.

## 2018-12-06 NOTE — H&P (View-Only) (Signed)
Patient ID: Todd Williamson, male   DOB: [birth year], 68 y.o.   MRN: [id number]  HPI Todd Williamson is a 68 y.o. male unknown to me with a symptomatic right inguinal hernia.  He reports that his symptoms are becoming more pronounced.  Experiences intermittent pain after a long workday.  His pain is dull moderate intensity and located in the right inguinal area.  Denies any radiation of the pain.  The patient endorses that symptoms have becoming more frequent.  I saw him 2 years ago and he postpone his surgery.  In the interim he did develop a TIA and is currently on daily Plavix.  No new neurological symptoms.  No evidence of a strokes.  No fevers no chills no weight loss. I Did personally reviewed his CT scan of the head showing no major intracranial abnormalities.  He did have a normal CMP and a CBC that were unremarkable. Denies Easy bruising or bleeding.  HPI  Past Medical History:  Diagnosis Date  . GERD (gastroesophageal reflux disease)   . Hyperlipidemia   . Hypertension   . TIA (transient ischemic attack) 2018    Past Surgical History:  Procedure Laterality Date  . COLONOSCOPY    . RETINAL DETACHMENT SURGERY Right 1982    Family History  Problem Relation Age of Onset  . Stroke Mother   . Heart disease Father   . Prostate cancer Brother   . Bone cancer Brother   . Heart disease Brother     Social History Social History   Tobacco Use  . Smoking status: Never Smoker  . Smokeless tobacco: Never Used  Substance Use Topics  . Alcohol use: Yes    Comment: occasionally  . Drug use: No    Allergies  Allergen Reactions  . Hydrochlorothiazide Nausea And Vomiting    Current Outpatient Medications  Medication Sig Dispense Refill  . amLODipine-benazepril (LOTREL) 10-20 MG capsule Take 1 capsule by mouth daily.     Marland Kitchen atorvastatin (LIPITOR) 40 MG tablet Take 40 mg by mouth daily.    Marland Kitchen azelastine (ASTELIN) 0.1 % nasal spray Place 1 spray into both nostrils as needed.     .  clopidogrel (PLAVIX) 75 MG tablet Take 75 mg by mouth daily.    Marland Kitchen loratadine (CLARITIN) 10 MG tablet Take 10 mg by mouth daily.    Marland Kitchen omeprazole (PRILOSEC) 20 MG capsule Take 20 mg by mouth daily.     No current facility-administered medications for this visit.      Review of Systems Full ROS  was asked and was negative except for the information on the HPI  Physical Exam Blood pressure (!) 149/90, pulse 61, temperature 97.8 F (36.6 C), height 6\' 1"  (1.854 m), weight 190 lb (86.2 kg), SpO2 97 %. CONSTITUTIONAL: NAD EYES: Pupils are equal, round, and reactive to light, Sclera are non-icteric. EARS, NOSE, MOUTH AND THROAT: The oropharynx is clear. The oral mucosa is pink and moist. Hearing is intact to voice. LYMPH NODES:  Lymph nodes in the neck are normal. RESPIRATORY:  Lungs are clear. There is normal respiratory effort, with equal breath sounds bilaterally, and without pathologic use of accessory muscles. CARDIOVASCULAR: Heart is regular without murmurs, gallops, or rubs. GI: The abdomen is  soft, nontender, and nondistended. There are no palpable masses. There is no hepatosplenomegaly. There are normal bowel sounds in all quadrants. There is reducible Umbilical hernia and Reducible RIH. Mildly tender to palpation. GU: Rectal deferred.   MUSCULOSKELETAL: Normal muscle strength  and tone. No cyanosis or edema.   SKIN: Turgor is good and there are no pathologic skin lesions or ulcers. NEUROLOGIC: Motor and sensation is grossly normal. Cranial nerves are grossly intact. PSYCH:  Oriented to person, place and time. Affect is normal.  Data Reviewed  I have personally reviewed the patient's imaging, laboratory findings and medical records.    Assessment/Plan 68 year old with a symptomatic right inguinal hernia.  Discussed with the patient in detail about my recommendations for repair.  I do think he is a very good candidate for robotic repair.  Procedure discussed with the patient in  detail.  Risk, benefits and possible complications including but not limited to: Bleeding, infection, chronic pain, mesh problems.  He understands and wishes to proceed.  We also went over perioperative expectations. He needs to stop his Plavix 7 days prior to the operation, d/w the pt in detail. We Will repair the umbilical hernia during the same operative time.   Caroleen Hamman, MD FACS General Surgeon 12/06/2018, 10:38 AM

## 2018-12-06 NOTE — Patient Instructions (Addendum)
You have chose to have your hernias repaired. This will be done by Dr. Dahlia Byes at [facility]. You will need to stop your Plavix 7 days prior.    You will need to arrange to be out of work for 2 weeks and then return with a lifting restrictions for 4 more weeks. Please send any FMLA paperwork prior to surgery and we will fill this out and fax it back to your employer within 3 business days.  You may have a bruise in your groin and also swelling and brusing in your testicle area. You may use ice 4-5 times daily for 15-20 minutes each time. Make sure that you place a barrier between you and the ice pack. To decrease the swelling, you may roll up a bath towel and place it vertically in between your thighs with your testicles resting on the towel. You will want to keep this area elevated as much as possible for several days following surgery.    Inguinal Hernia, Adult Muscles help keep everything in the body in its proper place. But if a weak spot in the muscles develops, something can poke through. That is called a hernia. When this happens in the lower part of the belly (abdomen), it is called an inguinal hernia. (It takes its name from a part of the body in this region called the inguinal canal.) A weak spot in the wall of muscles lets some fat or part of the small intestine bulge through. An inguinal hernia can develop at any age. Men get them more often than women. CAUSES  In adults, an inguinal hernia develops over time.  It can be triggered by:  Suddenly straining the muscles of the lower abdomen.  Lifting heavy objects.  Straining to have a bowel movement. Difficult bowel movements (constipation) can lead to this.  Constant coughing. This may be caused by smoking or lung disease.  Being overweight.  Being pregnant.  Working at a job that requires long periods of standing or heavy lifting.  Having had an inguinal hernia before. One type can be an emergency situation. It is called a  strangulated inguinal hernia. It develops if part of the small intestine slips through the weak spot and cannot get back into the abdomen. The blood supply can be cut off. If that happens, part of the intestine may die. This situation requires emergency surgery. SYMPTOMS  Often, a small inguinal hernia has no symptoms. It is found when a healthcare provider does a physical exam. Larger hernias usually have symptoms.   In adults, symptoms may include:  A lump in the groin. This is easier to see when the person is standing. It might disappear when lying down.  In men, a lump in the scrotum.  Pain or burning in the groin. This occurs especially when lifting, straining or coughing.  A dull ache or feeling of pressure in the groin.  Signs of a strangulated hernia can include:  A bulge in the groin that becomes very painful and tender to the touch.  A bulge that turns red or purple.  Fever, nausea and vomiting.  Inability to have a bowel movement or to pass gas. DIAGNOSIS  To decide if you have an inguinal hernia, a healthcare provider will probably do a physical examination.  This will include asking questions about any symptoms you have noticed.  The healthcare provider might feel the groin area and ask you to cough. If an inguinal hernia is felt, the healthcare provider may try to  slide it back into the abdomen.  Usually no other tests are needed. TREATMENT  Treatments can vary. The size of the hernia makes a difference. Options include:  Watchful waiting. This is often suggested if the hernia is small and you have had no symptoms.  No medical procedure will be done unless symptoms develop.  You will need to watch closely for symptoms. If any occur, contact your healthcare provider right away.  Surgery. This is used if the hernia is larger or you have symptoms.  Open surgery. This is usually an outpatient procedure (you will not stay overnight in a hospital). An cut (incision)  is made through the skin in the groin. The hernia is put back inside the abdomen. The weak area in the muscles is then repaired by herniorrhaphy or hernioplasty. Herniorrhaphy: in this type of surgery, the weak muscles are sewn back together. Hernioplasty: a patch or mesh is used to close the weak area in the abdominal wall.  Laparoscopy. In this procedure, a surgeon makes small incisions. A thin tube with a tiny video camera (called a laparoscope) is put into the abdomen. The surgeon repairs the hernia with mesh by looking with the video camera and using two long instruments. HOME CARE INSTRUCTIONS   After surgery to repair an inguinal hernia:  You will need to take pain medicine prescribed by your healthcare provider. Follow all directions carefully.  You will need to take care of the wound from the incision.  Your activity will be restricted for awhile. This will probably include no heavy lifting for several weeks. You also should not do anything too active for a few weeks. When you can return to work will depend on the type of job that you have.  During "watchful waiting" periods, you should:  Maintain a healthy weight.  Eat a diet high in fiber (fruits, vegetables and whole grains).  Drink plenty of fluids to avoid constipation. This means drinking enough water and other liquids to keep your urine clear or pale yellow.  Do not lift heavy objects.  Do not stand for long periods of time.  Quit smoking. This should keep you from developing a frequent cough. SEEK MEDICAL CARE IF:   A bulge develops in your groin area.  You feel pain, a burning sensation or pressure in the groin. This might be worse if you are lifting or straining.  You develop a fever of more than 100.5 F (38.1 C). SEEK IMMEDIATE MEDICAL CARE IF:   Pain in the groin increases suddenly.  A bulge in the groin gets bigger suddenly and does not go down.  For men, there is sudden pain in the scrotum. Or, the size  of the scrotum increases.  A bulge in the groin area becomes red or purple and is painful to touch.  You have nausea or vomiting that does not go away.  You feel your heart beating much faster than normal.  You cannot have a bowel movement or pass gas.  You develop a fever of more than 102.0 F (38.9 C).   This information is not intended to replace advice given to you by your health care provider. Make sure you discuss any questions you have with your health care provider.   Document Released: 03/01/2009 Document Revised: 01/05/2012 Document Reviewed: 04/16/2015 Elsevier Interactive Patient Education [employer].

## 2018-12-06 NOTE — Telephone Encounter (Signed)
Patient contacted and aware surgery scheduled for 12-21-18.   The patient is aware to Pre-admit on 12-10-18 at 8 am at the [place] building.  He was again reminded to be off Plavix 7 days prior to surgery.   Patient verbalizes understanding.

## 2018-12-10 ENCOUNTER — Telehealth: Payer: Self-pay

## 2018-12-10 ENCOUNTER — Other Ambulatory Visit: Payer: Self-pay

## 2018-12-10 ENCOUNTER — Encounter
Admission: RE | Admit: 2018-12-10 | Discharge: 2018-12-10 | Disposition: A | Discharge status: Home / Self Care | Payer: PPO | Source: Ambulatory Visit | Attending: Surgery | Admitting: Surgery

## 2018-12-10 DIAGNOSIS — Z01818 Encounter for other preprocedural examination: Secondary | ICD-10-CM | POA: Diagnosis not present

## 2018-12-10 DIAGNOSIS — Z01810 Encounter for preprocedural cardiovascular examination: Secondary | ICD-10-CM | POA: Diagnosis not present

## 2018-12-10 LAB — CBC
HEMATOCRIT: 46.2 % (ref 39.0–52.0)
HEMOGLOBIN: 15.8 g/dL (ref 13.0–17.0)
MCH: 31.8 pg (ref 26.0–34.0)
MCHC: 34.2 g/dL (ref 30.0–36.0)
MCV: 93 fL (ref 80.0–100.0)
Platelets: 167 10*3/uL (ref 150–400)
RBC: 4.97 MIL/uL (ref 4.22–5.81)
RDW: 13 % (ref 11.5–15.5)
WBC: 4.1 10*3/uL (ref 4.0–10.5)
nRBC: 0 % (ref 0.0–0.2)

## 2018-12-10 NOTE — Patient Instructions (Signed)
Your procedure is scheduled on: Tuesday 12/21/18  Report to [place]. To find out your arrival time please call [phone] between 1PM - 3PM on Monday 12/20/18.   Remember: Instructions that are not followed completely may result in serious medical risk, up to and including death, or upon the discretion of your surgeon and anesthesiologist your surgery may need to be rescheduled.      _X__ 1. Do not eat food after midnight the night before your procedure.                 No gum chewing or hard candies. You may drink clear liquids up to 2 hours                 before you are scheduled to arrive for your surgery- DO NOT drink clear                 liquids within 2 hours of the start of your surgery.                 Clear Liquids include:  water, apple juice without pulp, clear carbohydrate                 drink such as Clearfast or Gatorade, Black Coffee or Tea (Do not add                 milk or creamer to coffee or tea).  __X__2.  On the morning of surgery brush your teeth with toothpaste and water, you may rinse your mouth with mouthwash if you wish.  Do not swallow any toothpaste or mouthwash.      _X__ 3.  No Alcohol for 24 hours before or after surgery.   __X__4.  Notify your doctor if there is any change in your medical condition      (cold, fever, infections).     Do not wear jewelry, make-up, hairpins, clips or nail polish. Do not wear lotions, powders, or perfumes.  Do not shave 48 hours prior to surgery. Men may shave face and neck. Do not bring valuables to the hospital.     [facility] is not responsible for any belongings or valuables.   Contacts, dentures/partials or body piercings may not be worn into surgery. Bring a case for your contacts, glasses or hearing aids, a denture cup will be supplied. .   Patients discharged the day of surgery will not be allowed to drive home.     Please read over the  following fact sheets that you were given:   MRSA Information    __X__ Take these medicines the morning of surgery with A SIP OF WATER:     1. atorvastatin (LIPITOR) 40 MG tablet  2. loratadine (CLARITIN) 10 MG tablet  3. omeprazole (PRILOSEC) 20 MG capsule. Please take this the night before and the morning of your surgery.     __X__ Use CHG Soap as directed   __X__ Stop Blood Thinners Plavix: Dr. Dahlia Byes has instructed you to stop Plavix 7 days prior to your surgery. Your last dose will be on Tuesday 12/14/18.   __X__ Stop Anti-inflammatories 7 days before surgery such as Advil, Ibuprofen, Motrin, BC or Goodies Powder, Naprosyn, Naproxen, Aleve, Aspirin, Meloxicam. May take Tylenol if needed for pain or discomfort.    __X__ Do not begin taking any new herbal supplements before your procedure.

## 2018-12-10 NOTE — Telephone Encounter (Signed)
Labs are normal per DR.Pabon -patient notified.

## 2018-12-21 ENCOUNTER — Ambulatory Visit: Payer: PPO | Admitting: Anesthesiology

## 2018-12-21 ENCOUNTER — Ambulatory Visit
Admission: RE | Admit: 2018-12-21 | Discharge: 2018-12-21 | Disposition: A | Discharge status: Home / Self Care | Payer: PPO | Source: Ambulatory Visit | Attending: Surgery | Admitting: Surgery

## 2018-12-21 ENCOUNTER — Encounter
Admission: RE | Disposition: A | Discharge status: Home / Self Care | Payer: Self-pay | Source: Ambulatory Visit | Attending: Surgery

## 2018-12-21 DIAGNOSIS — Z79899 Other long term (current) drug therapy: Secondary | ICD-10-CM | POA: Diagnosis not present

## 2018-12-21 DIAGNOSIS — K429 Umbilical hernia without obstruction or gangrene: Secondary | ICD-10-CM | POA: Diagnosis not present

## 2018-12-21 DIAGNOSIS — K219 Gastro-esophageal reflux disease without esophagitis: Secondary | ICD-10-CM | POA: Insufficient documentation

## 2018-12-21 DIAGNOSIS — Z7902 Long term (current) use of antithrombotics/antiplatelets: Secondary | ICD-10-CM | POA: Insufficient documentation

## 2018-12-21 DIAGNOSIS — I10 Essential (primary) hypertension: Secondary | ICD-10-CM | POA: Insufficient documentation

## 2018-12-21 DIAGNOSIS — K4091 Unilateral inguinal hernia, without obstruction or gangrene, recurrent: Secondary | ICD-10-CM

## 2018-12-21 DIAGNOSIS — E785 Hyperlipidemia, unspecified: Secondary | ICD-10-CM | POA: Diagnosis not present

## 2018-12-21 DIAGNOSIS — K4090 Unilateral inguinal hernia, without obstruction or gangrene, not specified as recurrent: Secondary | ICD-10-CM | POA: Insufficient documentation

## 2018-12-21 DIAGNOSIS — Z8673 Personal history of transient ischemic attack (TIA), and cerebral infarction without residual deficits: Secondary | ICD-10-CM | POA: Insufficient documentation

## 2018-12-21 HISTORY — PX: UMBILICAL HERNIA REPAIR: SHX196

## 2018-12-21 HISTORY — PX: ROBOT ASSISTED INGUINAL HERNIA REPAIR: SHX6561

## 2018-12-21 SURGERY — ROBOT ASSISTED INGUINAL HERNIA REPAIR
Anesthesia: General | Laterality: Right

## 2018-12-21 MED ORDER — DIPHENHYDRAMINE HCL 25 MG PO CAPS
25.0000 mg | ORAL_CAPSULE | Freq: Once | ORAL | Status: AC
  Administered 2018-12-21: 25 mg via ORAL

## 2018-12-21 MED ORDER — SEVOFLURANE IN SOLN
RESPIRATORY_TRACT | Status: AC
  Filled 2018-12-21: qty 250

## 2018-12-21 MED ORDER — ACETAMINOPHEN 500 MG PO TABS
ORAL_TABLET | ORAL | Status: AC
  Administered 2018-12-21: 1000 mg via ORAL
  Filled 2018-12-21: qty 1

## 2018-12-21 MED ORDER — EPHEDRINE SULFATE 50 MG/ML IJ SOLN
INTRAMUSCULAR | Status: DC | PRN
  Administered 2018-12-21 (×2): 10 mg via INTRAVENOUS

## 2018-12-21 MED ORDER — ROCURONIUM BROMIDE 100 MG/10ML IV SOLN
INTRAVENOUS | Status: DC | PRN
  Administered 2018-12-21: 50 mg via INTRAVENOUS
  Administered 2018-12-21: 20 mg via INTRAVENOUS

## 2018-12-21 MED ORDER — PROPOFOL 10 MG/ML IV BOLUS
INTRAVENOUS | Status: DC | PRN
  Administered 2018-12-21: 130 mg via INTRAVENOUS

## 2018-12-21 MED ORDER — LACTATED RINGERS IV SOLN: INTRAVENOUS | Status: DC

## 2018-12-21 MED ORDER — ACETAMINOPHEN 500 MG PO TABS
1000.0000 mg | ORAL_TABLET | ORAL | Status: AC
  Administered 2018-12-21: 1000 mg via ORAL

## 2018-12-21 MED ORDER — GABAPENTIN 300 MG PO CAPS
300.0000 mg | ORAL_CAPSULE | ORAL | Status: AC
  Administered 2018-12-21: 300 mg via ORAL

## 2018-12-21 MED ORDER — LIDOCAINE HCL (CARDIAC) PF 100 MG/5ML IV SOSY
PREFILLED_SYRINGE | INTRAVENOUS | Status: DC | PRN
  Administered 2018-12-21: 100 mg via INTRAVENOUS

## 2018-12-21 MED ORDER — LACTATED RINGERS IV SOLN
INTRAVENOUS | Status: DC | PRN
  Administered 2018-12-21: 08:00:00 via INTRAVENOUS

## 2018-12-21 MED ORDER — OXYCODONE HCL 5 MG PO TABS: 5.0000 mg | ORAL_TABLET | Freq: Once | ORAL | Status: DC | PRN

## 2018-12-21 MED ORDER — CELECOXIB 200 MG PO CAPS
ORAL_CAPSULE | ORAL | Status: AC
  Administered 2018-12-21: 200 mg via ORAL
  Filled 2018-12-21: qty 1

## 2018-12-21 MED ORDER — CEFAZOLIN SODIUM-DEXTROSE 2-4 GM/100ML-% IV SOLN
2.0000 g | INTRAVENOUS | Status: AC
  Administered 2018-12-21: 2 g via INTRAVENOUS

## 2018-12-21 MED ORDER — CEFAZOLIN SODIUM-DEXTROSE 2-4 GM/100ML-% IV SOLN
INTRAVENOUS | Status: AC
  Filled 2018-12-21: qty 100

## 2018-12-21 MED ORDER — PROPOFOL 10 MG/ML IV BOLUS
INTRAVENOUS | Status: AC
  Filled 2018-12-21: qty 20

## 2018-12-21 MED ORDER — CELECOXIB 200 MG PO CAPS
200.0000 mg | ORAL_CAPSULE | ORAL | Status: AC
  Administered 2018-12-21: 200 mg via ORAL

## 2018-12-21 MED ORDER — CHLORHEXIDINE GLUCONATE CLOTH 2 % EX PADS: 6.0000 | MEDICATED_PAD | Freq: Once | CUTANEOUS | Status: DC

## 2018-12-21 MED ORDER — FENTANYL CITRATE (PF) 100 MCG/2ML IJ SOLN
INTRAMUSCULAR | Status: AC
  Administered 2018-12-21: 25 ug via INTRAVENOUS
  Filled 2018-12-21: qty 2

## 2018-12-21 MED ORDER — OXYCODONE HCL 5 MG/5ML PO SOLN: 5.0000 mg | Freq: Once | ORAL | Status: DC | PRN

## 2018-12-21 MED ORDER — DIPHENHYDRAMINE HCL 25 MG PO CAPS
ORAL_CAPSULE | ORAL | Status: AC
  Filled 2018-12-21: qty 1

## 2018-12-21 MED ORDER — MIDAZOLAM HCL 2 MG/2ML IJ SOLN
INTRAMUSCULAR | Status: DC | PRN
  Administered 2018-12-21: 2 mg via INTRAVENOUS

## 2018-12-21 MED ORDER — HYDROCODONE-ACETAMINOPHEN 5-325 MG PO TABS: 1.0000 | ORAL_TABLET | Freq: Four times a day (QID) | ORAL | 0 refills | Status: DC | PRN

## 2018-12-21 MED ORDER — MIDAZOLAM HCL 2 MG/2ML IJ SOLN
INTRAMUSCULAR | Status: AC
  Filled 2018-12-21: qty 2

## 2018-12-21 MED ORDER — PHENYLEPHRINE HCL 10 MG/ML IJ SOLN
INTRAMUSCULAR | Status: DC | PRN
  Administered 2018-12-21: 200 ug via INTRAVENOUS
  Administered 2018-12-21 (×2): 100 ug via INTRAVENOUS

## 2018-12-21 MED ORDER — FENTANYL CITRATE (PF) 100 MCG/2ML IJ SOLN
INTRAMUSCULAR | Status: AC
  Filled 2018-12-21: qty 2

## 2018-12-21 MED ORDER — SUGAMMADEX SODIUM 500 MG/5ML IV SOLN
INTRAVENOUS | Status: DC | PRN
  Administered 2018-12-21: 200 mg via INTRAVENOUS

## 2018-12-21 MED ORDER — FENTANYL CITRATE (PF) 100 MCG/2ML IJ SOLN
INTRAMUSCULAR | Status: DC | PRN
  Administered 2018-12-21 (×2): 50 ug via INTRAVENOUS

## 2018-12-21 MED ORDER — PROMETHAZINE HCL 25 MG/ML IJ SOLN: 6.2500 mg | INTRAMUSCULAR | Status: DC | PRN

## 2018-12-21 MED ORDER — GABAPENTIN 300 MG PO CAPS
ORAL_CAPSULE | ORAL | Status: AC
  Administered 2018-12-21: 300 mg via ORAL
  Filled 2018-12-21: qty 1

## 2018-12-21 MED ORDER — BUPIVACAINE-EPINEPHRINE 0.25% -1:200000 IJ SOLN
INTRAMUSCULAR | Status: DC | PRN
  Administered 2018-12-21: 30 mL

## 2018-12-21 MED ORDER — DEXAMETHASONE SODIUM PHOSPHATE 10 MG/ML IJ SOLN
INTRAMUSCULAR | Status: DC | PRN
  Administered 2018-12-21: 4 mg via INTRAVENOUS

## 2018-12-21 MED ORDER — FENTANYL CITRATE (PF) 100 MCG/2ML IJ SOLN
25.0000 ug | INTRAMUSCULAR | Status: DC | PRN
  Administered 2018-12-21 (×2): 25 ug via INTRAVENOUS

## 2018-12-21 MED ORDER — ONDANSETRON HCL 4 MG/2ML IJ SOLN
INTRAMUSCULAR | Status: DC | PRN
  Administered 2018-12-21: 4 mg via INTRAVENOUS

## 2018-12-21 MED ORDER — BUPIVACAINE-EPINEPHRINE (PF) 0.25% -1:200000 IJ SOLN
INTRAMUSCULAR | Status: AC
  Filled 2018-12-21: qty 30

## 2018-12-21 MED ORDER — MEPERIDINE HCL 50 MG/ML IJ SOLN: 6.2500 mg | INTRAMUSCULAR | Status: DC | PRN

## 2018-12-21 SURGICAL SUPPLY — 59 items
APPLICATOR COTTON TIP 6 STRL (MISCELLANEOUS) ×2 IMPLANT
APPLICATOR COTTON TIP 6IN STRL (MISCELLANEOUS) ×4
CANISTER SUCT 1200ML W/VALVE (MISCELLANEOUS) ×4 IMPLANT
CHLORAPREP W/TINT 26ML (MISCELLANEOUS) ×4 IMPLANT
CORD BIP STRL DISP 12FT (MISCELLANEOUS) ×4 IMPLANT
COVER TIP SHEARS 8 DVNC (MISCELLANEOUS) ×2 IMPLANT
COVER TIP SHEARS 8MM DA VINCI (MISCELLANEOUS) ×2
COVER WAND RF STERILE (DRAPES) ×4 IMPLANT
DEFOGGER SCOPE WARMER CLEARIFY (MISCELLANEOUS) ×4 IMPLANT
DERMABOND ADVANCED (GAUZE/BANDAGES/DRESSINGS) ×2
DERMABOND ADVANCED .7 DNX12 (GAUZE/BANDAGES/DRESSINGS) ×2 IMPLANT
DEVICE SECURE STRAP 25 ABSORB (INSTRUMENTS) IMPLANT
DRAPE 3 ARM ACCESS DA VINCI (DRAPES) ×2
DRAPE 3 ARM ACCESS DVNC (DRAPES) ×2 IMPLANT
DRAPE INCISE IOBAN 66X45 STRL (DRAPES) ×4 IMPLANT
DRAPE SHEET LG 3/4 BI-LAMINATE (DRAPES) ×8 IMPLANT
ELECT CAUTERY BLADE 6.4 (BLADE) ×4 IMPLANT
ELECT REM PT RETURN 9FT ADLT (ELECTROSURGICAL) ×4
ELECTRODE REM PT RTRN 9FT ADLT (ELECTROSURGICAL) ×2 IMPLANT
GLOVE BIO SURGEON STRL SZ7 (GLOVE) ×16 IMPLANT
GOWN STRL REUS W/ TWL LRG LVL3 (GOWN DISPOSABLE) ×8 IMPLANT
GOWN STRL REUS W/TWL LRG LVL3 (GOWN DISPOSABLE) ×8
GRASPER SUT TROCAR 14GX15 (MISCELLANEOUS) ×4 IMPLANT
HANDLE YANKAUER SUCT BULB TIP (MISCELLANEOUS) ×4 IMPLANT
IRRIGATION STRYKERFLOW (MISCELLANEOUS) IMPLANT
IRRIGATOR STRYKERFLOW (MISCELLANEOUS)
IV NS 1000ML (IV SOLUTION)
IV NS 1000ML BAXH (IV SOLUTION) ×2 IMPLANT
KIT PINK PAD W/HEAD ARE REST (MISCELLANEOUS) ×4
KIT PINK PAD W/HEAD ARM REST (MISCELLANEOUS) ×2 IMPLANT
L-HOOK LAP DISP 36CM (ELECTROSURGICAL) ×4
LABEL OR SOLS (LABEL) ×4 IMPLANT
LHOOK LAP DISP 36CM (ELECTROSURGICAL) ×2 IMPLANT
MESH 3DMAX 4X6 RT LRG (Mesh General) ×2 IMPLANT
NDL INSUFF ACCESS 14 VERSASTEP (NEEDLE) ×4 IMPLANT
NEEDLE HYPO 22GX1.5 SAFETY (NEEDLE) ×4 IMPLANT
NS IRRIG 500ML POUR BTL (IV SOLUTION) ×4 IMPLANT
PACK LAP CHOLECYSTECTOMY (MISCELLANEOUS) ×4 IMPLANT
PENCIL ELECTRO HAND CTR (MISCELLANEOUS) ×4 IMPLANT
PROGRASP ENDOWRIST DA VINCI (INSTRUMENTS) ×2
PROGRASP ENDOWRIST DVNC (INSTRUMENTS) ×2 IMPLANT
SCISSORS METZENBAUM CVD 33 (INSTRUMENTS) ×4 IMPLANT
SET TUBE SMOKE EVAC HIGH FLOW (TUBING) ×2 IMPLANT
SHEARS HARMONIC ACE PLUS 36CM (ENDOMECHANICALS) IMPLANT
SLEEVE ENDOPATH XCEL 5M (ENDOMECHANICALS) ×4 IMPLANT
SOLUTION ELECTROLUBE (MISCELLANEOUS) ×4 IMPLANT
SPONGE LAP 18X18 RF (DISPOSABLE) ×4 IMPLANT
STRAP SAFETY 5IN WIDE (MISCELLANEOUS) ×4 IMPLANT
SUT ETHIBOND 0 MO6 C/R (SUTURE) IMPLANT
SUT MNCRL AB 4-0 PS2 18 (SUTURE) ×4 IMPLANT
SUT VIC AB 0 SH 27 (SUTURE) ×4 IMPLANT
SUT VIC AB 2-0 SH 27 (SUTURE) ×4
SUT VIC AB 2-0 SH 27XBRD (SUTURE) ×4 IMPLANT
SUT VICRYL 0 AB UR-6 (SUTURE) ×8 IMPLANT
SUT VLOC 90 S/L VL9 GS22 (SUTURE) ×4 IMPLANT
TROCAR BALLN GELPORT 12X130M (ENDOMECHANICALS) ×4 IMPLANT
TROCAR XCEL NON-BLD 11X100MML (ENDOMECHANICALS) ×4 IMPLANT
TROCAR XCEL NON-BLD 5MMX100MML (ENDOMECHANICALS) ×8 IMPLANT
TUBING EVAC SMOKE HEATED PNEUM (TUBING) ×4 IMPLANT

## 2018-12-21 NOTE — Anesthesia Procedure Notes (Signed)
Procedure Name: Intubation Date/Time: 12/21/2018 7:43 AM Performed by: Justus Memory, CRNA Pre-anesthesia Checklist: Patient identified, Patient being monitored, Timeout performed, Emergency Drugs available and Suction available Patient Re-evaluated:Patient Re-evaluated prior to induction Oxygen Delivery Method: Circle system utilized Preoxygenation: Pre-oxygenation with 100% oxygen Induction Type: IV induction Ventilation: Mask ventilation without difficulty Laryngoscope Size: Mac and 3 Grade View: Grade I Tube type: Oral Tube size: 7.0 mm Number of attempts: 1 Airway Equipment and Method: Stylet Placement Confirmation: ETT inserted through vocal cords under direct vision,  positive ETCO2 and breath sounds checked- equal and bilateral Secured at: 20 cm Tube secured with: Tape Dental Injury: Teeth and Oropharynx as per pre-operative assessment

## 2018-12-21 NOTE — Transfer of Care (Signed)
Immediate Anesthesia Transfer of Care Note  Patient: Todd Williamson  Procedure(s) Performed: Kristine Royal ASSISTED INGUINAL HERNIA REPAIR (Right ) LAPAROSCOPIC UMBILICAL HERNIA - ROBOTIC (N/A )  Patient Location: PACU  Anesthesia Type:General  Level of Consciousness: sedated  Airway & Oxygen Therapy: Patient Spontanous Breathing and Patient connected to face mask oxygen  Post-op Assessment: Report given to RN and Post -op Vital signs reviewed and stable  Post vital signs: Reviewed and stable  Last Vitals:  Vitals Value Taken Time  BP 118/71 12/21/2018  9:43 AM  Temp 36.4 C 12/21/2018  9:43 AM  Pulse 79 12/21/2018  9:56 AM  Resp 12 12/21/2018  9:56 AM  SpO2 92 % 12/21/2018  9:56 AM  Vitals shown include unvalidated device data.  Last Pain:  Vitals:   12/21/18 0613  TempSrc: Tympanic         Complications: No apparent anesthesia complications

## 2018-12-21 NOTE — Anesthesia Post-op Follow-up Note (Signed)
Anesthesia QCDR form completed.        

## 2018-12-21 NOTE — Anesthesia Preprocedure Evaluation (Signed)
Anesthesia Evaluation  Patient identified by MRN, date of birth, ID band Patient awake    Reviewed: Allergy & Precautions, NPO status , Patient's Chart, lab work & pertinent test results  History of Anesthesia Complications Negative for: history of anesthetic complications  Airway Mallampati: II  TM Distance: >3 FB Neck ROM: Full    Dental no notable dental hx.    Pulmonary neg pulmonary ROS, neg sleep apnea, neg COPD,    breath sounds clear to auscultation- rhonchi (-) wheezing      Cardiovascular hypertension, Pt. on medications (-) CAD, (-) Past MI, (-) Cardiac Stents and (-) CABG  Rhythm:Regular Rate:Normal - Systolic murmurs and - Diastolic murmurs    Neuro/Psych neg Seizures TIAnegative psych ROS   GI/Hepatic Neg liver ROS, GERD  ,  Endo/Other  negative endocrine ROSneg diabetes  Renal/GU negative Renal ROS     Musculoskeletal negative musculoskeletal ROS (+)   Abdominal (+) - obese,   Peds  Hematology negative hematology ROS (+)   Anesthesia Other Findings Past Medical History: No date: GERD (gastroesophageal reflux disease) No date: Hyperlipidemia No date: Hypertension 2018: TIA (transient ischemic attack)   Reproductive/Obstetrics                             Anesthesia Physical Anesthesia Plan  ASA: III  Anesthesia Plan: General   Post-op Pain Management:    Induction: Intravenous  PONV Risk Score and Plan: 1 and Ondansetron and Dexamethasone  Airway Management Planned: Oral ETT  Additional Equipment:   Intra-op Plan:   Post-operative Plan: Extubation in OR  Informed Consent: I have reviewed the patients History and Physical, chart, labs and discussed the procedure including the risks, benefits and alternatives for the proposed anesthesia with the patient or authorized representative who has indicated his/her understanding and acceptance.     Dental advisory  given  Plan Discussed with: CRNA and Anesthesiologist  Anesthesia Plan Comments:         Anesthesia Quick Evaluation

## 2018-12-21 NOTE — Op Note (Signed)
Robotic assisted Laparoscopic Transabdominal Right Inguinal Hernia Repair with 3 D large Mesh BARD Primary repair of Umbilical hernia   Todd Williamson   10/21/2018   Pre-operative Diagnosis:  Right Inguinal Hernia and Umbilical hernia   Post-operative Diagnosis: Same   Procedure: Robotic  Laparoscopic  repair of Right inguinal hernia and Umbilical hernia repair   Surgeon: Caroleen Hamman, MD FACS   Anesthesia: Gen. with endotracheal tube   Findings: Right Indirect inguinal, UH hernias        Procedure Details  The patient was seen again in the Holding Room. The benefits, complications, treatment options, and expected outcomes were discussed with the patient. The risks of bleeding, infection, recurrence of symptoms, failure to resolve symptoms, recurrence of hernia, ischemic orchitis, chronic pain syndrome or neuroma, were discussed again. The likelihood of improving the patient's symptoms with return to their baseline status is good.  The patient and/or family concurred with the proposed plan, giving informed consent.  The patient was taken to Operating Room, identified as Todd Williamson and the procedure verified as Laparoscopic Inguinal Hernia Repair. Laterality confirmed.  A Time Out was held and the above information confirmed.   Prior to the induction of general anesthesia, antibiotic prophylaxis was administered. VTE prophylaxis was in place. General endotracheal anesthesia was then administered and tolerated well. After the induction, the abdomen was prepped with Chloraprep and draped in the sterile fashion. The patient was positioned in the supine position.     Umbilical incision created, tailored incision to the umbilical region given the small hernia defect.  Are able to dissect the subcutaneous tissue identified the sac dissected circumferentially and excise it .A cut down technique used to enter the abdominal cavity. Fascia elevated and incised and hasson trochar placed.  Pneumoperitoneum obtained w/o HD changes. No evidence of bowel injuries.  Two 8 mm placed under direct vision. The laparoscopy revealed large indirect defects. I inserted the needles and the mesh. The robot was brought ot the OR table and docked in the standard fashion, no collision between arms was observed. Instruments were kept under direct view at all times. We started on the right side were a flap was created. The sac was reduced and dissected free from adjacent structures. We preserved the vas and the vessels. Once dissection was completed a large 3D mesh was placed and secured with two interrupted vicryl attached to the pubic tubercle. There was good coverage of the direct, indirect and femoral spaces. The flap was closed with v lock suture.   Second look revealed no complications or injuries.    Once assuring that hemostasis was adequate the ports were removed and the umbilical hernia was repair using multiple interrupted 0 ethibond sutures.Marland Kitchen 4-0 subcuticular Monocryl was used at all skin edges. Dermabond was placed.  Patient tolerated the procedure well. There were no complications. He was taken to the recovery room in stable condition.                 Caroleen Hamman, MD, FACS

## 2018-12-21 NOTE — Anesthesia Postprocedure Evaluation (Signed)
Anesthesia Post Note  Patient: Todd Williamson  Procedure(s) Performed: ROBOT ASSISTED INGUINAL HERNIA REPAIR (Right ) LAPAROSCOPIC UMBILICAL HERNIA - ROBOTIC (N/A )  Patient location during evaluation: PACU Anesthesia Type: General Level of consciousness: awake and alert and oriented Pain management: pain level controlled Vital Signs Assessment: post-procedure vital signs reviewed and stable Respiratory status: spontaneous breathing, nonlabored ventilation and respiratory function stable Cardiovascular status: blood pressure returned to baseline and stable Postop Assessment: no signs of nausea or vomiting Anesthetic complications: no   Pt given peanut butter crackers in postop area, did not have peanuts listed as an allergy, pt was aware they were peanut butter when he ate them, when his wife came in she told him he shouldn't be eating peanut butter because of a reaction in the past. Pt was given benadryl preventatively, not experiencing any allergic symptoms. Discussed with pt that if he does develop symptoms at any point he needs to seek medical attention. Peanuts added to pt's allergies.  Last Vitals:  Vitals:   12/21/18 1022 12/21/18 1125  BP: 110/69 101/69  Pulse: 75 62  Resp: 18 18  Temp: 36.6 C   SpO2: 92% 94%    Last Pain:  Vitals:   12/21/18 1125  TempSrc:   PainSc: 2                  Regino Fournet

## 2018-12-21 NOTE — Discharge Instructions (Signed)

## 2018-12-21 NOTE — Interval H&P Note (Signed)
History and Physical Interval Note:  12/21/2018 7:20 AM  Todd Williamson  has presented today for surgery, with the diagnosis of Right inguinal hernia; umbilical hernia  The various methods of treatment have been discussed with the patient and family. After consideration of risks, benefits and other options for treatment, the patient has consented to  Procedure(s): [place] (Right) LAPAROSCOPIC UMBILICAL HERNIA - ROBOTIC (N/A) as a surgical intervention .  The patient's history has been reviewed, patient examined, no change in status, stable for surgery.  I have reviewed the patient's chart and labs.  Questions were answered to the patient's satisfaction.     [place]

## 2018-12-22 LAB — SURGICAL PATHOLOGY

## 2019-01-03 ENCOUNTER — Encounter: Payer: Self-pay | Admitting: Surgery

## 2019-01-03 ENCOUNTER — Ambulatory Visit (INDEPENDENT_AMBULATORY_CARE_PROVIDER_SITE_OTHER): Payer: PPO | Admitting: Surgery

## 2019-01-03 ENCOUNTER — Other Ambulatory Visit: Payer: Self-pay

## 2019-01-03 VITALS — BP 139/87 | HR 71 | Temp 97.8°F | Ht 74.0 in | Wt 186.0 lb

## 2019-01-03 DIAGNOSIS — Z09 Encounter for follow-up examination after completed treatment for conditions other than malignant neoplasm: Secondary | ICD-10-CM

## 2019-01-03 NOTE — Progress Notes (Signed)
S/p Rob RIH and UH Doing great No pain Did not need any narcotics Taking po  PE NA Abd: soft, nt, incisions c/d/i, no recurrence or infection  A/p Doing very well No heavy lifting RTC prn

## 2019-01-03 NOTE — Patient Instructions (Addendum)
Return as needed.No lifting over 20 pounds for the next four  weeks. The patient is aware to call back for any questions or concerns.

## 2019-05-17 DIAGNOSIS — I10 Essential (primary) hypertension: Secondary | ICD-10-CM | POA: Diagnosis not present

## 2019-05-17 DIAGNOSIS — E7800 Pure hypercholesterolemia, unspecified: Secondary | ICD-10-CM | POA: Diagnosis not present

## 2019-05-17 DIAGNOSIS — Z125 Encounter for screening for malignant neoplasm of prostate: Secondary | ICD-10-CM | POA: Diagnosis not present

## 2019-05-24 DIAGNOSIS — Z0000 Encounter for general adult medical examination without abnormal findings: Secondary | ICD-10-CM | POA: Diagnosis not present

## 2019-05-24 DIAGNOSIS — E7800 Pure hypercholesterolemia, unspecified: Secondary | ICD-10-CM | POA: Diagnosis not present

## 2019-05-24 DIAGNOSIS — I10 Essential (primary) hypertension: Secondary | ICD-10-CM | POA: Diagnosis not present

## 2019-11-17 DIAGNOSIS — E7800 Pure hypercholesterolemia, unspecified: Secondary | ICD-10-CM | POA: Diagnosis not present

## 2019-11-17 DIAGNOSIS — I10 Essential (primary) hypertension: Secondary | ICD-10-CM | POA: Diagnosis not present

## 2019-11-24 DIAGNOSIS — I10 Essential (primary) hypertension: Secondary | ICD-10-CM | POA: Diagnosis not present

## 2019-11-24 DIAGNOSIS — E7800 Pure hypercholesterolemia, unspecified: Secondary | ICD-10-CM | POA: Diagnosis not present

## 2019-11-24 DIAGNOSIS — Z8673 Personal history of transient ischemic attack (TIA), and cerebral infarction without residual deficits: Secondary | ICD-10-CM | POA: Diagnosis not present

## 2019-11-24 DIAGNOSIS — Z23 Encounter for immunization: Secondary | ICD-10-CM | POA: Diagnosis not present

## 2020-05-22 DIAGNOSIS — E7800 Pure hypercholesterolemia, unspecified: Secondary | ICD-10-CM | POA: Diagnosis not present

## 2020-05-22 DIAGNOSIS — Z125 Encounter for screening for malignant neoplasm of prostate: Secondary | ICD-10-CM | POA: Diagnosis not present

## 2020-05-22 DIAGNOSIS — Z8673 Personal history of transient ischemic attack (TIA), and cerebral infarction without residual deficits: Secondary | ICD-10-CM | POA: Diagnosis not present

## 2020-05-22 DIAGNOSIS — I10 Essential (primary) hypertension: Secondary | ICD-10-CM | POA: Diagnosis not present

## 2020-05-29 DIAGNOSIS — E7800 Pure hypercholesterolemia, unspecified: Secondary | ICD-10-CM | POA: Diagnosis not present

## 2020-05-29 DIAGNOSIS — I10 Essential (primary) hypertension: Secondary | ICD-10-CM | POA: Diagnosis not present

## 2020-05-29 DIAGNOSIS — Z0000 Encounter for general adult medical examination without abnormal findings: Secondary | ICD-10-CM | POA: Diagnosis not present

## 2020-11-22 DIAGNOSIS — I10 Essential (primary) hypertension: Secondary | ICD-10-CM | POA: Diagnosis not present

## 2020-11-22 DIAGNOSIS — E7800 Pure hypercholesterolemia, unspecified: Secondary | ICD-10-CM | POA: Diagnosis not present

## 2020-11-29 DIAGNOSIS — Z125 Encounter for screening for malignant neoplasm of prostate: Secondary | ICD-10-CM | POA: Diagnosis not present

## 2020-11-29 DIAGNOSIS — I779 Disorder of arteries and arterioles, unspecified: Secondary | ICD-10-CM | POA: Diagnosis not present

## 2020-11-29 DIAGNOSIS — I10 Essential (primary) hypertension: Secondary | ICD-10-CM | POA: Diagnosis not present

## 2020-11-29 DIAGNOSIS — E7800 Pure hypercholesterolemia, unspecified: Secondary | ICD-10-CM | POA: Diagnosis not present

## 2021-01-04 DIAGNOSIS — R482 Apraxia: Secondary | ICD-10-CM | POA: Diagnosis not present

## 2021-01-04 DIAGNOSIS — G319 Degenerative disease of nervous system, unspecified: Secondary | ICD-10-CM | POA: Diagnosis not present

## 2021-01-04 DIAGNOSIS — E538 Deficiency of other specified B group vitamins: Secondary | ICD-10-CM | POA: Diagnosis not present

## 2021-01-04 DIAGNOSIS — Z8673 Personal history of transient ischemic attack (TIA), and cerebral infarction without residual deficits: Secondary | ICD-10-CM | POA: Diagnosis not present

## 2021-01-04 DIAGNOSIS — E519 Thiamine deficiency, unspecified: Secondary | ICD-10-CM | POA: Diagnosis not present

## 2021-01-07 ENCOUNTER — Other Ambulatory Visit: Payer: Self-pay | Admitting: Neurology

## 2021-01-07 ENCOUNTER — Other Ambulatory Visit (HOSPITAL_COMMUNITY): Payer: Self-pay | Admitting: Neurology

## 2021-01-07 DIAGNOSIS — G319 Degenerative disease of nervous system, unspecified: Secondary | ICD-10-CM

## 2021-01-18 ENCOUNTER — Encounter (HOSPITAL_COMMUNITY): Payer: Self-pay

## 2021-01-18 ENCOUNTER — Ambulatory Visit (HOSPITAL_COMMUNITY): Admission: RE | Admit: 2021-01-18 | Payer: PPO | Source: Ambulatory Visit

## 2021-01-31 ENCOUNTER — Other Ambulatory Visit: Payer: Self-pay

## 2021-01-31 ENCOUNTER — Ambulatory Visit (HOSPITAL_COMMUNITY)
Admission: RE | Admit: 2021-01-31 | Discharge: 2021-01-31 | Disposition: A | Discharge status: Home / Self Care | Payer: PPO | Source: Ambulatory Visit | Attending: Neurology | Admitting: Neurology

## 2021-01-31 DIAGNOSIS — R930 Abnormal findings on diagnostic imaging of skull and head, not elsewhere classified: Secondary | ICD-10-CM | POA: Diagnosis not present

## 2021-01-31 DIAGNOSIS — G9389 Other specified disorders of brain: Secondary | ICD-10-CM | POA: Diagnosis not present

## 2021-01-31 DIAGNOSIS — J3489 Other specified disorders of nose and nasal sinuses: Secondary | ICD-10-CM | POA: Diagnosis not present

## 2021-01-31 DIAGNOSIS — G319 Degenerative disease of nervous system, unspecified: Secondary | ICD-10-CM | POA: Insufficient documentation

## 2021-01-31 DIAGNOSIS — R29818 Other symptoms and signs involving the nervous system: Secondary | ICD-10-CM | POA: Diagnosis not present

## 2021-01-31 MED ORDER — GADOBUTROL 1 MMOL/ML IV SOLN
8.5000 mL | Freq: Once | INTRAVENOUS | Status: AC | PRN
  Administered 2021-01-31: 8.5 mL via INTRAVENOUS

## 2021-02-25 DIAGNOSIS — R4189 Other symptoms and signs involving cognitive functions and awareness: Secondary | ICD-10-CM | POA: Diagnosis not present

## 2021-03-13 DIAGNOSIS — R4189 Other symptoms and signs involving cognitive functions and awareness: Secondary | ICD-10-CM | POA: Diagnosis not present

## 2021-04-01 DIAGNOSIS — R4189 Other symptoms and signs involving cognitive functions and awareness: Secondary | ICD-10-CM | POA: Diagnosis not present

## 2021-04-01 DIAGNOSIS — F0390 Unspecified dementia without behavioral disturbance: Secondary | ICD-10-CM | POA: Diagnosis not present

## 2021-04-10 ENCOUNTER — Ambulatory Visit: Payer: PPO | Admitting: Speech Pathology

## 2021-04-10 ENCOUNTER — Ambulatory Visit: Payer: PPO | Admitting: Occupational Therapy

## 2021-04-15 ENCOUNTER — Encounter: Payer: PPO | Admitting: Speech Pathology

## 2021-04-15 ENCOUNTER — Encounter: Payer: PPO | Admitting: Occupational Therapy

## 2021-04-17 ENCOUNTER — Encounter: Payer: PPO | Admitting: Speech Pathology

## 2021-04-17 ENCOUNTER — Encounter: Payer: PPO | Admitting: Occupational Therapy

## 2021-04-22 ENCOUNTER — Encounter: Payer: PPO | Admitting: Occupational Therapy

## 2021-04-22 ENCOUNTER — Encounter: Payer: PPO | Admitting: Speech Pathology

## 2021-04-24 ENCOUNTER — Encounter: Payer: PPO | Admitting: Speech Pathology

## 2021-04-24 ENCOUNTER — Encounter: Payer: PPO | Admitting: Occupational Therapy

## 2021-05-01 ENCOUNTER — Encounter: Payer: PPO | Admitting: Occupational Therapy

## 2021-05-01 ENCOUNTER — Encounter: Payer: PPO | Admitting: Speech Pathology

## 2021-05-06 ENCOUNTER — Encounter: Payer: PPO | Admitting: Speech Pathology

## 2021-05-06 ENCOUNTER — Encounter: Payer: PPO | Admitting: Occupational Therapy

## 2021-05-08 ENCOUNTER — Encounter: Payer: PPO | Admitting: Speech Pathology

## 2021-05-08 ENCOUNTER — Encounter: Payer: PPO | Admitting: Occupational Therapy

## 2021-05-13 ENCOUNTER — Encounter: Payer: PPO | Admitting: Occupational Therapy

## 2021-05-13 ENCOUNTER — Encounter: Payer: PPO | Admitting: Speech Pathology

## 2021-05-15 ENCOUNTER — Encounter: Payer: PPO | Admitting: Speech Pathology

## 2021-05-15 ENCOUNTER — Encounter: Payer: PPO | Admitting: Occupational Therapy

## 2021-05-20 ENCOUNTER — Encounter: Payer: PPO | Admitting: Speech Pathology

## 2021-05-20 ENCOUNTER — Encounter: Payer: PPO | Admitting: Occupational Therapy

## 2021-05-20 DIAGNOSIS — G248 Other dystonia: Secondary | ICD-10-CM | POA: Diagnosis not present

## 2021-05-20 DIAGNOSIS — R482 Apraxia: Secondary | ICD-10-CM | POA: Diagnosis not present

## 2021-05-20 DIAGNOSIS — G253 Myoclonus: Secondary | ICD-10-CM | POA: Diagnosis not present

## 2021-05-20 DIAGNOSIS — G319 Degenerative disease of nervous system, unspecified: Secondary | ICD-10-CM | POA: Diagnosis not present

## 2021-05-20 DIAGNOSIS — Z8673 Personal history of transient ischemic attack (TIA), and cerebral infarction without residual deficits: Secondary | ICD-10-CM | POA: Diagnosis not present

## 2021-05-20 DIAGNOSIS — E538 Deficiency of other specified B group vitamins: Secondary | ICD-10-CM | POA: Diagnosis not present

## 2021-05-22 ENCOUNTER — Encounter: Payer: PPO | Admitting: Occupational Therapy

## 2021-05-22 ENCOUNTER — Encounter: Payer: PPO | Admitting: Speech Pathology

## 2021-05-27 ENCOUNTER — Encounter: Payer: PPO | Admitting: Speech Pathology

## 2021-05-27 ENCOUNTER — Encounter: Payer: PPO | Admitting: Occupational Therapy

## 2021-05-28 DIAGNOSIS — E538 Deficiency of other specified B group vitamins: Secondary | ICD-10-CM | POA: Diagnosis not present

## 2021-05-28 DIAGNOSIS — E7800 Pure hypercholesterolemia, unspecified: Secondary | ICD-10-CM | POA: Diagnosis not present

## 2021-05-28 DIAGNOSIS — I10 Essential (primary) hypertension: Secondary | ICD-10-CM | POA: Diagnosis not present

## 2021-05-28 DIAGNOSIS — Z125 Encounter for screening for malignant neoplasm of prostate: Secondary | ICD-10-CM | POA: Diagnosis not present

## 2021-05-28 DIAGNOSIS — R7309 Other abnormal glucose: Secondary | ICD-10-CM | POA: Diagnosis not present

## 2021-05-29 ENCOUNTER — Encounter: Payer: PPO | Admitting: Occupational Therapy

## 2021-05-29 ENCOUNTER — Encounter: Payer: PPO | Admitting: Speech Pathology

## 2021-06-03 ENCOUNTER — Encounter: Payer: PPO | Admitting: Occupational Therapy

## 2021-06-03 ENCOUNTER — Encounter: Payer: PPO | Admitting: Speech Pathology

## 2021-06-04 DIAGNOSIS — I779 Disorder of arteries and arterioles, unspecified: Secondary | ICD-10-CM | POA: Diagnosis not present

## 2021-06-04 DIAGNOSIS — E538 Deficiency of other specified B group vitamins: Secondary | ICD-10-CM | POA: Diagnosis not present

## 2021-06-04 DIAGNOSIS — E7800 Pure hypercholesterolemia, unspecified: Secondary | ICD-10-CM | POA: Diagnosis not present

## 2021-06-04 DIAGNOSIS — Z0000 Encounter for general adult medical examination without abnormal findings: Secondary | ICD-10-CM | POA: Diagnosis not present

## 2021-06-04 DIAGNOSIS — I10 Essential (primary) hypertension: Secondary | ICD-10-CM | POA: Diagnosis not present

## 2021-06-04 DIAGNOSIS — F0390 Unspecified dementia without behavioral disturbance: Secondary | ICD-10-CM | POA: Diagnosis not present

## 2021-06-04 DIAGNOSIS — R7303 Prediabetes: Secondary | ICD-10-CM | POA: Diagnosis not present

## 2021-06-05 ENCOUNTER — Encounter: Payer: PPO | Admitting: Occupational Therapy

## 2021-06-05 ENCOUNTER — Encounter: Payer: PPO | Admitting: Speech Pathology

## 2021-06-10 ENCOUNTER — Encounter: Payer: PPO | Admitting: Speech Pathology

## 2021-06-10 ENCOUNTER — Encounter: Payer: PPO | Admitting: Occupational Therapy

## 2021-06-12 ENCOUNTER — Encounter: Payer: PPO | Admitting: Speech Pathology

## 2021-06-12 ENCOUNTER — Encounter: Payer: PPO | Admitting: Occupational Therapy

## 2021-06-17 ENCOUNTER — Encounter: Payer: PPO | Admitting: Occupational Therapy

## 2021-06-17 ENCOUNTER — Encounter: Payer: PPO | Admitting: Speech Pathology

## 2021-06-19 ENCOUNTER — Encounter: Payer: PPO | Admitting: Speech Pathology

## 2021-06-19 ENCOUNTER — Encounter: Payer: PPO | Admitting: Occupational Therapy

## 2021-06-24 ENCOUNTER — Encounter: Payer: PPO | Admitting: Occupational Therapy

## 2021-06-24 ENCOUNTER — Encounter: Payer: PPO | Admitting: Speech Pathology

## 2021-06-26 ENCOUNTER — Encounter: Payer: PPO | Admitting: Occupational Therapy

## 2021-06-26 ENCOUNTER — Encounter: Payer: PPO | Admitting: Speech Pathology

## 2021-10-07 DIAGNOSIS — F0390 Unspecified dementia without behavioral disturbance: Secondary | ICD-10-CM | POA: Diagnosis not present

## 2021-11-28 DIAGNOSIS — E538 Deficiency of other specified B group vitamins: Secondary | ICD-10-CM | POA: Diagnosis not present

## 2021-11-28 DIAGNOSIS — I779 Disorder of arteries and arterioles, unspecified: Secondary | ICD-10-CM | POA: Diagnosis not present

## 2021-11-28 DIAGNOSIS — I10 Essential (primary) hypertension: Secondary | ICD-10-CM | POA: Diagnosis not present

## 2021-11-28 DIAGNOSIS — R7303 Prediabetes: Secondary | ICD-10-CM | POA: Diagnosis not present

## 2021-12-02 DIAGNOSIS — G253 Myoclonus: Secondary | ICD-10-CM | POA: Diagnosis not present

## 2021-12-02 DIAGNOSIS — G319 Degenerative disease of nervous system, unspecified: Secondary | ICD-10-CM | POA: Diagnosis not present

## 2021-12-02 DIAGNOSIS — G248 Other dystonia: Secondary | ICD-10-CM | POA: Diagnosis not present

## 2021-12-02 DIAGNOSIS — F0280 Dementia in other diseases classified elsewhere without behavioral disturbance: Secondary | ICD-10-CM | POA: Diagnosis not present

## 2021-12-02 DIAGNOSIS — R482 Apraxia: Secondary | ICD-10-CM | POA: Diagnosis not present

## 2021-12-02 DIAGNOSIS — G3109 Other frontotemporal dementia: Secondary | ICD-10-CM | POA: Diagnosis not present

## 2021-12-02 DIAGNOSIS — Z8673 Personal history of transient ischemic attack (TIA), and cerebral infarction without residual deficits: Secondary | ICD-10-CM | POA: Diagnosis not present

## 2021-12-12 DIAGNOSIS — R7303 Prediabetes: Secondary | ICD-10-CM | POA: Diagnosis not present

## 2021-12-12 DIAGNOSIS — F0390 Unspecified dementia without behavioral disturbance: Secondary | ICD-10-CM | POA: Diagnosis not present

## 2021-12-12 DIAGNOSIS — I779 Disorder of arteries and arterioles, unspecified: Secondary | ICD-10-CM | POA: Diagnosis not present

## 2021-12-12 DIAGNOSIS — E7800 Pure hypercholesterolemia, unspecified: Secondary | ICD-10-CM | POA: Diagnosis not present

## 2021-12-12 DIAGNOSIS — E538 Deficiency of other specified B group vitamins: Secondary | ICD-10-CM | POA: Diagnosis not present

## 2021-12-12 DIAGNOSIS — I10 Essential (primary) hypertension: Secondary | ICD-10-CM | POA: Diagnosis not present

## 2022-05-08 DIAGNOSIS — H2589 Other age-related cataract: Secondary | ICD-10-CM | POA: Diagnosis not present

## 2022-05-08 DIAGNOSIS — H2512 Age-related nuclear cataract, left eye: Secondary | ICD-10-CM | POA: Diagnosis not present

## 2022-06-09 DIAGNOSIS — E7800 Pure hypercholesterolemia, unspecified: Secondary | ICD-10-CM | POA: Diagnosis not present

## 2022-06-09 DIAGNOSIS — F0390 Unspecified dementia without behavioral disturbance: Secondary | ICD-10-CM | POA: Diagnosis not present

## 2022-06-09 DIAGNOSIS — R7303 Prediabetes: Secondary | ICD-10-CM | POA: Diagnosis not present

## 2022-06-09 DIAGNOSIS — I10 Essential (primary) hypertension: Secondary | ICD-10-CM | POA: Diagnosis not present

## 2022-06-16 DIAGNOSIS — F0390 Unspecified dementia without behavioral disturbance: Secondary | ICD-10-CM | POA: Diagnosis not present

## 2022-06-16 DIAGNOSIS — R7303 Prediabetes: Secondary | ICD-10-CM | POA: Diagnosis not present

## 2022-06-16 DIAGNOSIS — I779 Disorder of arteries and arterioles, unspecified: Secondary | ICD-10-CM | POA: Diagnosis not present

## 2022-06-16 DIAGNOSIS — E538 Deficiency of other specified B group vitamins: Secondary | ICD-10-CM | POA: Diagnosis not present

## 2022-06-16 DIAGNOSIS — I10 Essential (primary) hypertension: Secondary | ICD-10-CM | POA: Diagnosis not present

## 2022-06-16 DIAGNOSIS — E7800 Pure hypercholesterolemia, unspecified: Secondary | ICD-10-CM | POA: Diagnosis not present

## 2022-06-16 DIAGNOSIS — Z0000 Encounter for general adult medical examination without abnormal findings: Secondary | ICD-10-CM | POA: Diagnosis not present

## 2022-11-19 ENCOUNTER — Emergency Department
Admission: EM | Admit: 2022-11-19 | Discharge: 2022-11-19 | Disposition: A | Discharge status: Home / Self Care | Payer: PPO | Attending: Emergency Medicine | Admitting: Emergency Medicine

## 2022-11-19 ENCOUNTER — Emergency Department: Payer: PPO

## 2022-11-19 ENCOUNTER — Other Ambulatory Visit: Payer: Self-pay

## 2022-11-19 DIAGNOSIS — I10 Essential (primary) hypertension: Secondary | ICD-10-CM | POA: Insufficient documentation

## 2022-11-19 DIAGNOSIS — Z8673 Personal history of transient ischemic attack (TIA), and cerebral infarction without residual deficits: Secondary | ICD-10-CM | POA: Insufficient documentation

## 2022-11-19 DIAGNOSIS — R270 Ataxia, unspecified: Secondary | ICD-10-CM | POA: Diagnosis not present

## 2022-11-19 DIAGNOSIS — D72819 Decreased white blood cell count, unspecified: Secondary | ICD-10-CM | POA: Insufficient documentation

## 2022-11-19 DIAGNOSIS — R42 Dizziness and giddiness: Secondary | ICD-10-CM | POA: Diagnosis not present

## 2022-11-19 DIAGNOSIS — R55 Syncope and collapse: Secondary | ICD-10-CM | POA: Diagnosis not present

## 2022-11-19 LAB — BASIC METABOLIC PANEL
Anion gap: 8 (ref 5–15)
BUN: 13 mg/dL (ref 8–23)
CO2: 22 mmol/L (ref 22–32)
Calcium: 8.9 mg/dL (ref 8.9–10.3)
Chloride: 111 mmol/L (ref 98–111)
Creatinine, Ser: 0.89 mg/dL (ref 0.61–1.24)
GFR, Estimated: >60 mL/min (ref >60)
Glucose, Bld: 122 mg/dL — ABNORMAL HIGH (ref 70–99)
Potassium: 3.6 mmol/L (ref 3.5–5.1)
Sodium: 141 mmol/L (ref 135–145)

## 2022-11-19 LAB — CBC
HCT: 45.3 % (ref 39.0–52.0)
Hemoglobin: 15.5 g/dL (ref 13.0–17.0)
MCH: 31.6 pg (ref 26.0–34.0)
MCHC: 34.2 g/dL (ref 30.0–36.0)
MCV: 92.4 fL (ref 80.0–100.0)
Platelets: 198 10*3/uL (ref 150–400)
RBC: 4.9 MIL/uL (ref 4.22–5.81)
RDW: 13.1 % (ref 11.5–15.5)
WBC: 3.2 10*3/uL — ABNORMAL LOW (ref 4.0–10.5)
nRBC: 0 % (ref 0.0–0.2)

## 2022-11-19 LAB — CBG MONITORING, ED: Glucose-Capillary: 152 mg/dL — ABNORMAL HIGH (ref 70–99)

## 2022-11-19 LAB — URINALYSIS, ROUTINE W REFLEX MICROSCOPIC
Bilirubin Urine: NEGATIVE
Glucose, UA: NEGATIVE mg/dL
Hgb urine dipstick: NEGATIVE
Ketones, ur: NEGATIVE mg/dL
Leukocytes,Ua: NEGATIVE
Nitrite: NEGATIVE
Protein, ur: NEGATIVE mg/dL
Specific Gravity, Urine: 1.011 (ref 1.005–1.030)
pH: 6 (ref 5.0–8.0)

## 2022-11-19 MED ORDER — MECLIZINE HCL 25 MG PO TABS
25.0000 mg | ORAL_TABLET | Freq: Once | ORAL | Status: AC
  Administered 2022-11-19: 25 mg via ORAL
  Filled 2022-11-19: qty 1

## 2022-11-19 MED ORDER — MECLIZINE HCL 12.5 MG PO TABS: 12.5000 mg | ORAL_TABLET | Freq: Three times a day (TID) | ORAL | 1 refills | Status: AC | PRN

## 2022-11-19 MED ORDER — ONDANSETRON 4 MG PO TBDP: 4.0000 mg | ORAL_TABLET | Freq: Four times a day (QID) | ORAL | 0 refills | Status: AC | PRN

## 2022-11-19 MED ORDER — SODIUM CHLORIDE 0.9 % IV BOLUS
500.0000 mL | Freq: Once | INTRAVENOUS | Status: AC
  Administered 2022-11-19: 500 mL via INTRAVENOUS

## 2022-11-19 MED ORDER — ONDANSETRON HCL 4 MG/2ML IJ SOLN
4.0000 mg | INTRAMUSCULAR | Status: AC
  Administered 2022-11-19: 4 mg via INTRAVENOUS
  Filled 2022-11-19: qty 2

## 2022-11-19 NOTE — ED Notes (Signed)
PT currently at [facility]

## 2022-11-19 NOTE — ED Provider Notes (Signed)
[facility] Provider Note    Event Date/Time   First MD Initiated Contact with Patient 11/19/22 0805     (approximate)   History   Dizziness   HPI  Todd Williamson is a 72 y.o. male history of acid reflux hyperlipidemia hypertension and previous TIA.  Reviewed his previous history and physical from February 2020  Patient normal state of health yesterday did not feel sick or ill no concerns.  He went to bed fine, at about 5 AM he got up to urinate and he told his wife he just felt a little bit off or dizzy.  He went back to bed.  He got up this morning again and while urinating he reports that he did put his head down and looked towards his feet and suddenly felt very dizzy.  He describes it as a spinning feeling.  Not lightheaded.  He does not feel weak.  As soon as it happened he felt nauseated.  Symptoms improved but he is noticed when they moved him from the bed for the CT scan he also developed similar symptoms of a spinning feeling got very nauseated and sweaty when that happened.  Denies fevers no chest pain no trouble breathing no stomach pain no diarrhea.  He has been staying well-hydrated.  No difficulty with urination has been in his normal state of health.  This is never happened to him before.  He has noticed in particular that his symptoms and nausea seem to go away but will come back when he moves his head  No numbness no difficulty speaking no headache no difficulty walking.  Arms and legs all functioning normally.  Wife reports he has had and did not have any episode of confusion during the episodes.       Physical Exam   Triage Vital Signs: ED Triage Vitals  Enc Vitals Group     BP 11/19/22 0729 (!) 146/96     Pulse Rate 11/19/22 0729 69     Resp 11/19/22 0729 18     Temp 11/19/22 0729 98.1 F (36.7 C)     Temp Source 11/19/22 0729 Oral     SpO2 11/19/22 0729 96 %     Weight 11/19/22 0731 186 lb 1.1 oz (84.4 kg)     Height 11/19/22 0731  '6\' 2"'$  (1.88 m)     Head Circumference --      Peak Flow --      Pain Score 11/19/22 0731 0     Pain Loc --      Pain Edu? --      Excl. in [place]? --     Most recent vital signs: Vitals:   11/19/22 1030 11/19/22 1222  BP: (!) 118/94 117/88  Pulse: 63 73  Resp: 16 12  Temp:  97.7 F (36.5 C)  SpO2: 96% 98%     General: Awake, no distress.  Very pleasant.  Does not appear in acute distress or extremis. Positive Dix-Hallpike when turning head to the left reproduces symptoms CV:  Good peripheral perfusion.  Normal rate rhythm and tones Resp:  Normal effort.  Clear bilaterally Abd:  No distention.  Soft nontender nondistended.  Patient reports when he got nauseated it was because he felt so dizzy does not anything do with his stomach Other:  Moves all extremities well.  Modified NIH score equals 0.  Additionally Dix-Hallpike testing on the left reproduces symptomatology.  Right side no acute.  No facial lesions.  No vision changes.  Extraocular movements are normal except during Dix-Hallpike testing or some nystagmus is present briefly   ED Results / Procedures / Treatments   Labs (all labs ordered are listed, but only abnormal results are displayed) Labs Reviewed  BASIC METABOLIC PANEL - Abnormal; Notable for the following components:      Result Value   Glucose, Bld 122 (*)    All other components within normal limits  CBC - Abnormal; Notable for the following components:   WBC 3.2 (*)    All other components within normal limits  URINALYSIS, ROUTINE W REFLEX MICROSCOPIC - Abnormal; Notable for the following components:   Color, Urine STRAW (*)    APPearance CLEAR (*)    All other components within normal limits  CBG MONITORING, ED - Abnormal; Notable for the following components:   Glucose-Capillary 152 (*)    All other components within normal limits     EKG  And interpreted by me at 735 heart rate 70 QRS 90 QTc 440 Normal sinus rhythm no evidence of acute  ischemia   RADIOLOGY  MR BRAIN WO CONTRAST  Result Date: 11/19/2022 CLINICAL DATA:  Ataxia, nontraumatic.  Exclude stroke. EXAM: MRI HEAD WITHOUT CONTRAST TECHNIQUE: Multiplanar, multiecho pulse sequences of the brain and surrounding structures were obtained without intravenous contrast. COMPARISON:  Head CT 11/19/2022.  MRI brain 01/31/2021. FINDINGS: Brain: No acute infarct or hemorrhage. 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 Otherwise unchanged chronic small-vessel disease. Prominence of the ventricles and sulci within normal limits for age. No extra-axial collection. Susceptibility in the right superior frontal sulcus, likely sequela of prior subarachnoid hemorrhage. Basilar cisterns are patent. Vascular: Normal flow voids. Skull and upper cervical spine: Normal marrow signal. Sinuses/Orbits: Paranasal sinuses, mastoid air cells, and middle ear cavities are well aerated. Orbits are unremarkable. Other: None. IMPRESSION: 1. No acute intracranial abnormality. 2. 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 Electronically Signed   By: Emmit Alexanders M.D.   On: 11/19/2022 11:22   CT HEAD WO CONTRAST  Result Date: 11/19/2022 CLINICAL DATA:  Syncope/presyncope, cerebrovascular cause suspected. Patient had a near syncopal episode this morning with hypertension. EXAM: CT HEAD WITHOUT CONTRAST TECHNIQUE: Contiguous axial images were obtained from the base of the skull through the vertex without intravenous contrast. RADIATION DOSE REDUCTION: This exam was performed according to the departmental dose-optimization program which includes automated exposure control, adjustment of the mA and/or kV according to patient size and/or use of iterative reconstruction technique. COMPARISON:  CT angio head 12/12/2016.  MR head 01/31/2021 FINDINGS: Brain: Mild atrophy and white matter  changes are similar to the prior exam. Deep brain nuclei are within normal limits. No acute infarct, hemorrhage, or mass lesion is present. The ventricles are of normal size. No significant extraaxial fluid collection is present. Insert normal midline The brainstem and cerebellum are within normal limits. Vascular: Atherosclerotic calcifications demonstrate some progression within the cavernous internal carotid arteries bilaterally. No hyperdense vessel is present. Skull: Calvarium is intact. No focal lytic or blastic lesions are present. No significant extracranial soft tissue lesion is present. Sinuses/Orbits: The globes and orbits are within normal limits. The paranasal sinuses and mastoid air cells are clear. IMPRESSION: 1. No acute intracranial abnormality or significant interval change. 2. Similar appearance of atrophy and white matter disease. This likely reflects the sequela of chronic microvascular ischemia. Electronically Signed  By: San Morelle M.D.   On: 11/19/2022 08:05    Reviewed MRI finding, possible old infarct but I do not think correlates with the patient's presentation to day.  Notified patient discussed with him and his wife, and at this point I do not believe this represents an acute infarct he will continue to follow-up with primary  PROCEDURES:  Critical Care performed: No  Procedures   MEDICATIONS ORDERED IN ED: Medications  ondansetron (ZOFRAN) injection 4 mg (4 mg Intravenous Given 11/19/22 0823)  meclizine (ANTIVERT) tablet 25 mg (25 mg Oral Given 11/19/22 0829)  sodium chloride 0.9 % bolus 500 mL (0 mLs Intravenous Stopped 11/19/22 1100)     IMPRESSION / MDM / [place] / ED COURSE  I reviewed the triage vital signs and the nursing notes.                              Differential diagnosis includes, but is not limited to, vertigo, possibly peripheral in nature given his symptomatology reproducibility and lack of neurologic symptoms, rule out  stroke, no evidence for symptoms suggest acute cardiopulmonary abnormality, no infectious symptoms.  May represent orthostatic symptomatology, or other causation.  At this point though his clinical symptoms seem to match that of vertigo likely of peripheral nature.  CT of the head was interpreted by me as negative for acute gross intracranial pathology.  MRI brain reviewed by our radiologist, and no notes of possible chronic infarct but I do not think this is clinically significant today's presentation.  Patient feels much improved after receiving meclizine and Zofran, able to ambulate back and forth to the bathroom without issue now and symptoms improving.  Discussed careful precautions of vertigo symptoms including recommendation for follow-up with primary care and ENT  Patient's presentation is most consistent with acute complicated illness / injury requiring diagnostic workup.   The patient is on the cardiac monitor to evaluate for evidence of arrhythmia and/or significant heart rate changes.   Labs are notable for some element of leukopenia, but is also had previously low white counts.  I do not believe this represents an acute infectious cause or obvious marrow suppression     FINAL CLINICAL IMPRESSION(S) / ED DIAGNOSES   Final diagnoses:  Vertigo     Rx / DC Orders   ED Discharge Orders          Ordered    meclizine (ANTIVERT) 12.5 MG tablet  3 times daily PRN        11/19/22 1251    ondansetron (ZOFRAN-ODT) 4 MG disintegrating tablet  Every 6 hours PRN        11/19/22 1251             Note:  This document was prepared using Dragon voice recognition software and may include unintentional dictation errors.   Delman Kitten, MD 11/19/22 1258

## 2022-11-19 NOTE — ED Notes (Signed)
Pt reports he is no longer feeling dizzy. Able to ambulate in room to use the bathroom with very minimal assistance.

## 2022-11-19 NOTE — Discharge Instructions (Addendum)
Do not drive while experiencing or intermittently experiencing symptoms of vertigo as this could cause you to crash.  Do not drive while taking meclizine as it may make you fatigued.

## 2022-11-19 NOTE — ED Triage Notes (Addendum)
Pt here with dizziness since this morning. Wife states pt had a near syncopal episode this morning. Pt states his bp this morning was 189/99. Pt denies nausea but was quivering a lot per the wife. Pt now states he does not feel dizzy or weak. Wife denies fall or hitting his head, hx of TIA.

## 2022-11-26 ENCOUNTER — Telehealth: Payer: Self-pay

## 2022-11-26 NOTE — Telephone Encounter (Signed)
        Patient  visited [facility] on 11/19/2022  for vertigo, dizziness.   Telephone encounter attempt :  1st  A HIPAA compliant voice message was left requesting a return call.  Instructed patient to call back at [phone].   [place] Resource Care Guide   [email or web address]  ?? [phone]   Website: [email or web address]

## 2022-12-01 ENCOUNTER — Telehealth: Payer: Self-pay

## 2022-12-01 NOTE — Telephone Encounter (Signed)
        Patient  visited [facility] on 11/19/2022  for vertigo, dizziness.   Telephone encounter attempt :  2nd  A HIPAA compliant voice message was left requesting a return call.  Instructed patient to call back at [phone].   [place] Resource Care Guide   [email or web address]  ?? [phone]   Website: [email or web address]  [email or web address]

## 2022-12-03 DIAGNOSIS — E538 Deficiency of other specified B group vitamins: Secondary | ICD-10-CM | POA: Diagnosis not present

## 2022-12-03 DIAGNOSIS — R7303 Prediabetes: Secondary | ICD-10-CM | POA: Diagnosis not present

## 2022-12-03 DIAGNOSIS — I10 Essential (primary) hypertension: Secondary | ICD-10-CM | POA: Diagnosis not present

## 2022-12-03 DIAGNOSIS — E7800 Pure hypercholesterolemia, unspecified: Secondary | ICD-10-CM | POA: Diagnosis not present

## 2022-12-04 ENCOUNTER — Telehealth: Payer: Self-pay

## 2022-12-04 NOTE — Telephone Encounter (Signed)
        Patient  visited [facility] on 11/19/2022  for vertigo, dizziness.   Telephone encounter attempt :  3rd  A HIPAA compliant voice message was left requesting a return call.  Instructed patient to call back at [phone].   [place] Resource Care Guide   ??[email or web address]  ?? [phone]   Website: [email or web address]  [email or web address]

## 2022-12-17 DIAGNOSIS — F0390 Unspecified dementia without behavioral disturbance: Secondary | ICD-10-CM | POA: Diagnosis not present

## 2022-12-17 DIAGNOSIS — I779 Disorder of arteries and arterioles, unspecified: Secondary | ICD-10-CM | POA: Diagnosis not present

## 2022-12-17 DIAGNOSIS — E7800 Pure hypercholesterolemia, unspecified: Secondary | ICD-10-CM | POA: Diagnosis not present

## 2022-12-17 DIAGNOSIS — R7303 Prediabetes: Secondary | ICD-10-CM | POA: Diagnosis not present

## 2022-12-17 DIAGNOSIS — E538 Deficiency of other specified B group vitamins: Secondary | ICD-10-CM | POA: Diagnosis not present

## 2022-12-17 DIAGNOSIS — I10 Essential (primary) hypertension: Secondary | ICD-10-CM | POA: Diagnosis not present

## 2023-01-05 ENCOUNTER — Other Ambulatory Visit: Payer: Self-pay | Admitting: Internal Medicine

## 2023-01-05 ENCOUNTER — Other Ambulatory Visit
Admission: RE | Admit: 2023-01-05 | Discharge: 2023-01-05 | Disposition: A | Discharge status: Home / Self Care | Payer: PPO | Source: Ambulatory Visit | Attending: Internal Medicine | Admitting: Internal Medicine

## 2023-01-05 ENCOUNTER — Ambulatory Visit
Admission: RE | Admit: 2023-01-05 | Discharge: 2023-01-05 | Disposition: A | Discharge status: Home / Self Care | Payer: PPO | Source: Ambulatory Visit | Attending: Internal Medicine | Admitting: Internal Medicine

## 2023-01-05 DIAGNOSIS — R7989 Other specified abnormal findings of blood chemistry: Secondary | ICD-10-CM

## 2023-01-05 DIAGNOSIS — R600 Localized edema: Secondary | ICD-10-CM | POA: Diagnosis not present

## 2023-01-05 DIAGNOSIS — L03116 Cellulitis of left lower limb: Secondary | ICD-10-CM | POA: Insufficient documentation

## 2023-01-05 LAB — D-DIMER, QUANTITATIVE: D-Dimer, Quant: 0.88 ug/mL-FEU — ABNORMAL HIGH (ref 0.00–0.50)

## 2023-06-09 DIAGNOSIS — R7303 Prediabetes: Secondary | ICD-10-CM | POA: Diagnosis not present

## 2023-06-09 DIAGNOSIS — I779 Disorder of arteries and arterioles, unspecified: Secondary | ICD-10-CM | POA: Diagnosis not present

## 2023-06-29 DIAGNOSIS — H2512 Age-related nuclear cataract, left eye: Secondary | ICD-10-CM | POA: Diagnosis not present

## 2023-06-29 DIAGNOSIS — H1032 Unspecified acute conjunctivitis, left eye: Secondary | ICD-10-CM | POA: Diagnosis not present

## 2023-07-01 DIAGNOSIS — H1032 Unspecified acute conjunctivitis, left eye: Secondary | ICD-10-CM | POA: Diagnosis not present

## 2023-07-01 DIAGNOSIS — H2589 Other age-related cataract: Secondary | ICD-10-CM | POA: Diagnosis not present

## 2023-07-01 DIAGNOSIS — H2512 Age-related nuclear cataract, left eye: Secondary | ICD-10-CM | POA: Diagnosis not present

## 2023-07-08 DIAGNOSIS — I779 Disorder of arteries and arterioles, unspecified: Secondary | ICD-10-CM | POA: Diagnosis not present

## 2023-07-08 DIAGNOSIS — I10 Essential (primary) hypertension: Secondary | ICD-10-CM | POA: Diagnosis not present

## 2023-07-08 DIAGNOSIS — Z8669 Personal history of other diseases of the nervous system and sense organs: Secondary | ICD-10-CM | POA: Diagnosis not present

## 2023-07-08 DIAGNOSIS — F0390 Unspecified dementia without behavioral disturbance: Secondary | ICD-10-CM | POA: Diagnosis not present

## 2023-07-08 DIAGNOSIS — H2512 Age-related nuclear cataract, left eye: Secondary | ICD-10-CM | POA: Diagnosis not present

## 2023-07-08 DIAGNOSIS — Z0000 Encounter for general adult medical examination without abnormal findings: Secondary | ICD-10-CM | POA: Diagnosis not present

## 2023-07-08 DIAGNOSIS — H2589 Other age-related cataract: Secondary | ICD-10-CM | POA: Diagnosis not present

## 2023-07-08 DIAGNOSIS — R7303 Prediabetes: Secondary | ICD-10-CM | POA: Diagnosis not present

## 2023-07-22 DIAGNOSIS — H2512 Age-related nuclear cataract, left eye: Secondary | ICD-10-CM | POA: Diagnosis not present

## 2023-07-31 ENCOUNTER — Encounter: Payer: Self-pay | Admitting: Ophthalmology

## 2023-08-04 ENCOUNTER — Encounter: Payer: Self-pay | Admitting: Ophthalmology

## 2023-08-04 NOTE — Anesthesia Preprocedure Evaluation (Addendum)
Anesthesia Evaluation  Patient identified by MRN, date of birth, ID band Patient awake    Reviewed: Allergy & Precautions, H&P , NPO status , Patient's Chart, lab work & pertinent test results  Airway Mallampati: III  TM Distance: >3 FB Neck ROM: Full    Dental no notable dental hx.    Pulmonary neg pulmonary ROS   Pulmonary exam normal breath sounds clear to auscultation       Cardiovascular hypertension, Normal cardiovascular exam Rhythm:Regular Rate:Normal  12-02-06 EF >55%, grade I diastolic dysfunction, E/A reversal, mild TR, atrial shunt with valsalva, trivial MR   Neuro/Psych  PSYCHIATRIC DISORDERS     Dementia TIACVA    GI/Hepatic negative GI ROS, Neg liver ROS,GERD  ,,  Endo/Other  negative endocrine ROS    Renal/GU negative Renal ROS  negative genitourinary   Musculoskeletal negative musculoskeletal ROS (+)    Abdominal   Peds negative pediatric ROS (+)  Hematology negative hematology ROS (+)   Anesthesia Other Findings TIA (transient ischemic attack) Hypertension GERD (gastroesophageal reflux disease) Hyperlipidemia Stroke (HCC)  Dementia (HCC) Grade I diastolic dysfunction  Mild tricuspid regurgitation by prior echocardiogram  Wife reports that patient will suddenly have rather forceful "jerks" of head and body, has no idea when these will occur, and no control over them. Discussed w/Dr. Brooke Dare. He plans to tape the head for stability and he will discuss w/patient and patient's wife.     Reproductive/Obstetrics negative OB ROS                              Anesthesia Physical Anesthesia Plan  ASA: 3  Anesthesia Plan: MAC   Post-op Pain Management:    Induction: Intravenous  PONV Risk Score and Plan:   Airway Management Planned: Natural Airway and Nasal Cannula  Additional Equipment:   Intra-op Plan:   Post-operative Plan:   Informed Consent: I have reviewed  the patients History and Physical, chart, labs and discussed the procedure including the risks, benefits and alternatives for the proposed anesthesia with the patient or authorized representative who has indicated his/her understanding and acceptance.     Dental Advisory Given  Plan Discussed with: Anesthesiologist, CRNA and Surgeon  Anesthesia Plan Comments: (Patient consented for risks of anesthesia including but not limited to:  - adverse reactions to medications - damage to eyes, teeth, lips or other oral mucosa - nerve damage due to positioning  - sore throat or hoarseness - Damage to heart, brain, nerves, lungs, other parts of body or loss of life  Patient voiced understanding and assent.)         Anesthesia Quick Evaluation

## 2023-08-07 NOTE — Discharge Instructions (Signed)

## 2023-08-10 ENCOUNTER — Ambulatory Visit
Admission: RE | Admit: 2023-08-10 | Discharge: 2023-08-10 | Disposition: A | Discharge status: Home / Self Care | Payer: PPO | Attending: Ophthalmology | Admitting: Ophthalmology

## 2023-08-10 ENCOUNTER — Ambulatory Visit: Payer: PPO | Admitting: Anesthesiology

## 2023-08-10 ENCOUNTER — Encounter: Payer: Self-pay | Admitting: Ophthalmology

## 2023-08-10 ENCOUNTER — Other Ambulatory Visit: Payer: Self-pay

## 2023-08-10 ENCOUNTER — Encounter
Admission: RE | Disposition: A | Discharge status: Home / Self Care | Payer: Self-pay | Source: Home / Self Care | Attending: Ophthalmology

## 2023-08-10 DIAGNOSIS — Z8673 Personal history of transient ischemic attack (TIA), and cerebral infarction without residual deficits: Secondary | ICD-10-CM | POA: Insufficient documentation

## 2023-08-10 DIAGNOSIS — K219 Gastro-esophageal reflux disease without esophagitis: Secondary | ICD-10-CM | POA: Diagnosis not present

## 2023-08-10 DIAGNOSIS — I361 Nonrheumatic tricuspid (valve) insufficiency: Secondary | ICD-10-CM | POA: Diagnosis not present

## 2023-08-10 DIAGNOSIS — I10 Essential (primary) hypertension: Secondary | ICD-10-CM | POA: Insufficient documentation

## 2023-08-10 DIAGNOSIS — Z823 Family history of stroke: Secondary | ICD-10-CM | POA: Diagnosis not present

## 2023-08-10 DIAGNOSIS — H2512 Age-related nuclear cataract, left eye: Secondary | ICD-10-CM | POA: Diagnosis not present

## 2023-08-10 DIAGNOSIS — Z8249 Family history of ischemic heart disease and other diseases of the circulatory system: Secondary | ICD-10-CM | POA: Insufficient documentation

## 2023-08-10 DIAGNOSIS — F0390 Unspecified dementia without behavioral disturbance: Secondary | ICD-10-CM | POA: Diagnosis not present

## 2023-08-10 DIAGNOSIS — H269 Unspecified cataract: Secondary | ICD-10-CM | POA: Diagnosis not present

## 2023-08-10 HISTORY — DX: Cerebral infarction, unspecified: I63.9

## 2023-08-10 HISTORY — DX: Unspecified dementia, unspecified severity, without behavioral disturbance, psychotic disturbance, mood disturbance, and anxiety: F03.90

## 2023-08-10 HISTORY — DX: Rheumatic tricuspid insufficiency: I07.1

## 2023-08-10 HISTORY — PX: CATARACT EXTRACTION W/PHACO: SHX586

## 2023-08-10 HISTORY — DX: Other ill-defined heart diseases: I51.89

## 2023-08-10 SURGERY — PHACOEMULSIFICATION, CATARACT, WITH IOL INSERTION
Anesthesia: Monitor Anesthesia Care | Site: Eye | Laterality: Left

## 2023-08-10 MED ORDER — SIGHTPATH DOSE#1 BSS IO SOLN
INTRAOCULAR | Status: DC | PRN
  Administered 2023-08-10: 15 mL

## 2023-08-10 MED ORDER — FENTANYL CITRATE (PF) 100 MCG/2ML IJ SOLN
INTRAMUSCULAR | Status: DC | PRN
  Administered 2023-08-10 (×2): 50 ug via INTRAVENOUS

## 2023-08-10 MED ORDER — TETRACAINE HCL 0.5 % OP SOLN
OPHTHALMIC | Status: AC
  Filled 2023-08-10: qty 4

## 2023-08-10 MED ORDER — MIDAZOLAM HCL 2 MG/2ML IJ SOLN
INTRAMUSCULAR | Status: AC
  Filled 2023-08-10: qty 2

## 2023-08-10 MED ORDER — LACTATED RINGERS IV SOLN: INTRAVENOUS | Status: DC

## 2023-08-10 MED ORDER — SODIUM CHLORIDE 0.9% FLUSH: 10.0000 mL | INTRAVENOUS | Status: DC | PRN

## 2023-08-10 MED ORDER — ARMC OPHTHALMIC DILATING DROPS
OPHTHALMIC | Status: AC
  Filled 2023-08-10: qty 0.5

## 2023-08-10 MED ORDER — LIDOCAINE HCL (PF) 2 % IJ SOLN
INTRAOCULAR | Status: DC | PRN
  Administered 2023-08-10: 1 mL via INTRAOCULAR

## 2023-08-10 MED ORDER — FLUMAZENIL NICU IV SYRINGE 0.1 MG/ML
0.2000 mg | INTRAVENOUS | Status: DC | PRN
  Administered 2023-08-10: 0.2 mg via INTRAVENOUS

## 2023-08-10 MED ORDER — FENTANYL CITRATE (PF) 100 MCG/2ML IJ SOLN
INTRAMUSCULAR | Status: AC
  Filled 2023-08-10: qty 2

## 2023-08-10 MED ORDER — FLUMAZENIL 0.5 MG/5ML IV SOLN
INTRAVENOUS | Status: AC
  Filled 2023-08-10: qty 5

## 2023-08-10 MED ORDER — ARMC OPHTHALMIC DILATING DROPS
1.0000 | OPHTHALMIC | Status: DC | PRN
  Administered 2023-08-10 (×3): 1 via OPHTHALMIC

## 2023-08-10 MED ORDER — MIDAZOLAM HCL 2 MG/2ML IJ SOLN
INTRAMUSCULAR | Status: DC | PRN
  Administered 2023-08-10: 1 mg via INTRAVENOUS
  Administered 2023-08-10: 2 mg via INTRAVENOUS
  Administered 2023-08-10: 1 mg via INTRAVENOUS

## 2023-08-10 MED ORDER — MOXIFLOXACIN HCL 0.5 % OP SOLN
OPHTHALMIC | Status: DC | PRN
  Administered 2023-08-10: .2 mL via OPHTHALMIC

## 2023-08-10 MED ORDER — TETRACAINE HCL 0.5 % OP SOLN
1.0000 [drp] | OPHTHALMIC | Status: DC | PRN
  Administered 2023-08-10 (×3): 1 [drp] via OPHTHALMIC

## 2023-08-10 MED ORDER — SIGHTPATH DOSE#1 BSS IO SOLN
INTRAOCULAR | Status: DC | PRN
  Administered 2023-08-10: 97 mL via OPHTHALMIC

## 2023-08-10 MED ORDER — SIGHTPATH DOSE#1 NA HYALUR & NA CHOND-NA HYALUR IO KIT
PACK | INTRAOCULAR | Status: DC | PRN
  Administered 2023-08-10: 1 via OPHTHALMIC

## 2023-08-10 SURGICAL SUPPLY — 18 items
BVI eye fluid wick IMPLANT
CANNULA ANT/CHMB 27G (MISCELLANEOUS) IMPLANT
CANNULA ANT/CHMB 27GA (MISCELLANEOUS)
CATARACT SUITE SIGHTPATH (MISCELLANEOUS) ×1
DISSECTOR HYDRO NUCLEUS 50X22 (MISCELLANEOUS) ×1 IMPLANT
FEE CATARACT SUITE SIGHTPATH (MISCELLANEOUS) ×1 IMPLANT
GLOVE SURG GAMMEX PI TX LF 7.5 (GLOVE) ×1 IMPLANT
GLOVE SURG SYN 8.5 E (GLOVE) ×1
GLOVE SURG SYN 8.5 PF PI (GLOVE) ×1 IMPLANT
LENS IOL TECNIS EYHANCE 21.5 (Intraocular Lens) IMPLANT
NDL FILTER BLUNT 18X1 1/2 (NEEDLE) ×1 IMPLANT
NEEDLE FILTER BLUNT 18X1 1/2 (NEEDLE) ×1
PACK VIT ANT 23G (MISCELLANEOUS) IMPLANT
RING MALYGIN (MISCELLANEOUS) IMPLANT
SUT ETHILON 10-0 CS-B-6CS-B-6 (SUTURE)
SUTURE EHLN 10-0 CS-B-6CS-B-6 (SUTURE) IMPLANT
SYR 3ML LL SCALE MARK (SYRINGE) ×1 IMPLANT
SYR 5ML LL (SYRINGE) ×1 IMPLANT

## 2023-08-10 NOTE — Op Note (Signed)
OPERATIVE NOTE  Todd Williamson [id number] 08/10/2023   PREOPERATIVE DIAGNOSIS:  Nuclear sclerotic cataract left eye.  H25.12   POSTOPERATIVE DIAGNOSIS:    Nuclear sclerotic cataract left eye.     PROCEDURE:  Phacoemusification with posterior chamber intraocular lens placement of the left eye   LENS:   Implant Name Type Inv. Item Serial No. Manufacturer Lot No. LRB No. Used Action  LENS IOL TECNIS EYHANCE 21.5 - [id number] Intraocular Lens LENS IOL TECNIS EYHANCE 21.5 [id number] [employer]  Left 1 Implanted      Procedure(s) with comments: CATARACT EXTRACTION PHACO AND INTRAOCULAR LENS PLACEMENT (IOC) LEFT (Left) - 8.59 0:55.6  SURGEON:  Willey Blade, MD, MPH   ANESTHESIA:  Topical with tetracaine drops augmented with 1% preservative-free intracameral lidocaine.  ESTIMATED BLOOD LOSS: <1 mL   COMPLICATIONS:  None.   DESCRIPTION OF PROCEDURE:  The patient was identified in the holding room and transported to the operating room and placed in the supine position under the operating microscope.  The left eye was identified as the operative eye and it was prepped and draped in the usual sterile ophthalmic fashion.  The patient has a history of dementia and sudden "violent" movements, so his head was taped.  Visualization was occasionally a challenge with his prominent brow and a small palpebral fissure.     A 1.0 millimeter clear-corneal paracentesis was made at the 5:00 position. 0.5 ml of preservative-free 1% lidocaine with epinephrine was injected into the anterior chamber.  The anterior chamber was filled with viscoelastic.  A 2.4 millimeter keratome was used to make a near-clear corneal incision at the 2:00 position.  A curvilinear capsulorrhexis was made with a cystotome and capsulorrhexis forceps.  Balanced salt solution was used to hydrodissect and hydrodelineate the nucleus.   Phacoemulsification was then used in stop and chop fashion to remove the lens nucleus and epinucleus.   The remaining cortex was then removed using the irrigation and aspiration handpiece. Viscoelastic was then placed into the capsular bag to distend it for lens placement.  A lens was then injected into the capsular bag.  The remaining viscoelastic was aspirated.   Wounds were hydrated with balanced salt solution.  The anterior chamber was inflated to a physiologic pressure with balanced salt solution.  Intracameral vigamox 0.1 mL undiltued was injected into the eye and a drop placed onto the ocular surface.   No wound leaks were noted.  The patient was taken to the recovery room in stable condition without complications of anesthesia or surgery  Willey Blade 08/10/2023, 9:45 AM

## 2023-08-10 NOTE — Anesthesia Postprocedure Evaluation (Signed)
Anesthesia Post Note  Patient: Todd Williamson  Procedure(s) Performed: CATARACT EXTRACTION PHACO AND INTRAOCULAR LENS PLACEMENT (IOC) LEFT (Left: Eye)  Patient location during evaluation: PACU Anesthesia Type: MAC Level of consciousness: awake and alert Pain management: pain level controlled Vital Signs Assessment: post-procedure vital signs reviewed and stable Respiratory status: spontaneous breathing, nonlabored ventilation, respiratory function stable and patient connected to nasal cannula oxygen Cardiovascular status: stable and blood pressure returned to baseline Postop Assessment: no apparent nausea or vomiting Anesthetic complications: no   No notable events documented.   Last Vitals:  Vitals:   08/10/23 1000 08/10/23 1015  BP: 104/76 (!) 107/90  Pulse: (!) 56 (!) 56  Resp: 10 15  Temp: (!) 36.1 C   SpO2: 94% 94%    Last Pain:  Vitals:   08/10/23 0800  TempSrc: Temporal                 [name] [name]

## 2023-08-10 NOTE — Transfer of Care (Signed)
Immediate Anesthesia Transfer of Care Note  Patient: Todd Williamson  Procedure(s) Performed: CATARACT EXTRACTION PHACO AND INTRAOCULAR LENS PLACEMENT (IOC) LEFT (Left: Eye)  Patient Location: PACU  Anesthesia Type: MAC  Level of Consciousness: awake, alert  and patient cooperative  Airway and Oxygen Therapy: Patient Spontanous Breathing and Patient connected to supplemental oxygen  Post-op Assessment: Post-op Vital signs reviewed, Patient's Cardiovascular Status Stable, Respiratory Function Stable, Patent Airway and No signs of Nausea or vomiting  Post-op Vital Signs: Reviewed and stable  Complications: No notable events documented.

## 2023-08-10 NOTE — H&P (Signed)
[facility]   Primary Care Physician:  Lauro Regulus, MD Ophthalmologist: Dr. Willey Blade  Pre-Procedure History & Physical: HPI:  Todd Williamson is a 72 y.o. male here for cataract surgery.   Past Medical History:  Diagnosis Date   Dementia (HCC)    GERD (gastroesophageal reflux disease)    Grade I diastolic dysfunction    Hyperlipidemia    Hypertension    Mild tricuspid regurgitation by prior echocardiogram    Stroke [facility])    TIA (transient ischemic attack) 2018    Past Surgical History:  Procedure Laterality Date   COLONOSCOPY     RETINAL DETACHMENT SURGERY Right 1982   ROBOT ASSISTED INGUINAL HERNIA REPAIR Right 12/21/2018   Procedure: ROBOT ASSISTED INGUINAL HERNIA REPAIR;  Surgeon: Leafy Ro, MD;  Location: [facility] ORS;  Service: General;  Laterality: Right;   UMBILICAL HERNIA REPAIR N/A 12/21/2018   Procedure: LAPAROSCOPIC UMBILICAL HERNIA - ROBOTIC;  Surgeon: Leafy Ro, MD;  Location: [facility] ORS;  Service: General;  Laterality: N/A;    Prior to Admission medications   Medication Sig Start Date End Date Taking? Authorizing Provider  amLODipine-benazepril (LOTREL) 10-20 MG capsule Take 1 capsule by mouth daily.  05/16/16  Yes [provider]  atorvastatin (LIPITOR) 40 MG tablet Take 40 mg by mouth daily. 09/21/18  Yes [provider]  Cinnamon 500 MG TABS Take 1,000 mg by mouth once.   Yes [provider]  clopidogrel (PLAVIX) 75 MG tablet Take 75 mg by mouth daily. 10/24/18  Yes [provider]  Cyanocobalamin (VITAMIN B 12) 500 MCG TABS Take 2 tablets by mouth once.   Yes [provider]  fluticasone (FLONASE) 50 MCG/ACT nasal spray Place 1 spray into both nostrils daily as needed for allergies or rhinitis.   Yes [provider]  loratadine (CLARITIN) 10 MG tablet Take 10 mg by mouth daily.   Yes [provider]  meclizine (ANTIVERT) 12.5 MG tablet Take 1 tablet (12.5 mg total) by  mouth 3 (three) times daily as needed for dizziness or nausea. 11/19/22  Yes Sharyn Creamer, MD  omeprazole (PRILOSEC) 20 MG capsule Take 20 mg by mouth daily.   Yes [provider]  ondansetron (ZOFRAN-ODT) 4 MG disintegrating tablet Take 1 tablet (4 mg total) by mouth every 6 (six) hours as needed for nausea or vomiting. Patient not taking: Reported on 07/31/2023 11/19/22   Sharyn Creamer, MD    Allergies as of 07/13/2023 - Review Complete 11/19/2022  Allergen Reaction Noted   Hydrochlorothiazide Nausea And Vomiting 02/18/2012   Peanut-containing drug products Other (See Comments) 12/21/2018   Shellfish allergy Other (See Comments) 11/19/2022    Family History  Problem Relation Age of Onset   Stroke Mother    Heart disease Father    Prostate cancer Brother    Bone cancer Brother    Heart disease Brother     Social History   Socioeconomic History   Marital status: Married    Spouse name: Not on file   Number of children: Not on file   Years of education: Not on file   Highest education level: Not on file  Occupational History   Not on file  Tobacco Use   Smoking status: Never   Smokeless tobacco: Never  Substance and Sexual Activity   Alcohol use: Yes    Comment: occasionally   Drug use: No   Sexual activity: Not on file  Other Topics Concern   Not on  file  Social History Narrative   Not on file   Social Determinants of Health   Financial Resource Strain: Low Risk  (07/08/2023)   Received from [facility]   Overall Financial Resource Strain (CARDIA)    Difficulty of Paying Living Expenses: Not hard at all  Food Insecurity: No Food Insecurity (07/08/2023)   Received from [facility]   Hunger Vital Sign    Worried About Running Out of Food in the Last Year: Never true    Ran Out of Food in the Last Year: Never true  Transportation Needs: No Transportation Needs (07/08/2023)   Received from [facility] - Transportation    In the past 12 months, has lack of transportation kept you from medical appointments or from getting medications?: No    Lack of Transportation (Non-Medical): No  Physical Activity: Not on file  Stress: Not on file  Social Connections: Not on file  Intimate Partner Violence: Not on file    Review of Systems: See HPI, otherwise negative ROS  Physical Exam: Pulse 70   Temp (!) 97 F (36.1 C) (Temporal)   Resp 15   Ht 6\' 1"  (1.854 m)   Wt 83.9 kg   BMI 24.41 kg/m  General:   Alert, cooperative in NAD Head:  Normocephalic and atraumatic. Respiratory:  Normal work of breathing. Cardiovascular:  RRR  Impression/Plan: Todd Williamson is here for cataract surgery.  Risks, benefits, limitations, and alternatives regarding cataract surgery have been reviewed with the patient.  Questions have been answered.  All parties agreeable.   Willey Blade, MD  08/10/2023, 9:07 AM

## 2023-08-10 NOTE — Progress Notes (Signed)
Patient is doing well, did have to give romazicon in PACU due to increased drowsiness and need for supplemental 02. Patient responded well to reversal first dose. Will continue to monitor, patient is doing well on room air now.

## 2023-08-13 ENCOUNTER — Encounter: Payer: Self-pay | Admitting: Ophthalmology

## 2023-09-10 DIAGNOSIS — Z961 Presence of intraocular lens: Secondary | ICD-10-CM | POA: Diagnosis not present

## 2023-09-10 DIAGNOSIS — H524 Presbyopia: Secondary | ICD-10-CM | POA: Diagnosis not present

## 2024-01-01 DIAGNOSIS — R7303 Prediabetes: Secondary | ICD-10-CM | POA: Diagnosis not present

## 2024-01-01 DIAGNOSIS — I10 Essential (primary) hypertension: Secondary | ICD-10-CM | POA: Diagnosis not present

## 2024-01-07 DIAGNOSIS — R7303 Prediabetes: Secondary | ICD-10-CM | POA: Diagnosis not present

## 2024-01-07 DIAGNOSIS — I779 Disorder of arteries and arterioles, unspecified: Secondary | ICD-10-CM | POA: Diagnosis not present

## 2024-01-07 DIAGNOSIS — F0390 Unspecified dementia without behavioral disturbance: Secondary | ICD-10-CM | POA: Diagnosis not present

## 2024-01-07 DIAGNOSIS — I10 Essential (primary) hypertension: Secondary | ICD-10-CM | POA: Diagnosis not present

## 2024-01-07 DIAGNOSIS — E7800 Pure hypercholesterolemia, unspecified: Secondary | ICD-10-CM | POA: Diagnosis not present

## 2024-01-07 DIAGNOSIS — E538 Deficiency of other specified B group vitamins: Secondary | ICD-10-CM | POA: Diagnosis not present

## 2024-03-04 ENCOUNTER — Emergency Department

## 2024-03-04 ENCOUNTER — Other Ambulatory Visit: Payer: Self-pay

## 2024-03-04 ENCOUNTER — Encounter: Payer: Self-pay | Admitting: *Deleted

## 2024-03-04 ENCOUNTER — Emergency Department
Admission: EM | Admit: 2024-03-04 | Discharge: 2024-03-04 | Disposition: A | Discharge status: Home / Self Care | Attending: Emergency Medicine | Admitting: Emergency Medicine

## 2024-03-04 DIAGNOSIS — F0390 Unspecified dementia without behavioral disturbance: Secondary | ICD-10-CM | POA: Diagnosis not present

## 2024-03-04 DIAGNOSIS — I10 Essential (primary) hypertension: Secondary | ICD-10-CM | POA: Diagnosis not present

## 2024-03-04 DIAGNOSIS — M7120 Synovial cyst of popliteal space [Baker], unspecified knee: Secondary | ICD-10-CM

## 2024-03-04 DIAGNOSIS — M7121 Synovial cyst of popliteal space [Baker], right knee: Secondary | ICD-10-CM | POA: Insufficient documentation

## 2024-03-04 DIAGNOSIS — M7989 Other specified soft tissue disorders: Secondary | ICD-10-CM | POA: Diagnosis not present

## 2024-03-04 DIAGNOSIS — Z7902 Long term (current) use of antithrombotics/antiplatelets: Secondary | ICD-10-CM | POA: Diagnosis not present

## 2024-03-04 MED ORDER — DICLOFENAC SODIUM 75 MG PO TBEC
75.0000 mg | DELAYED_RELEASE_TABLET | Freq: Two times a day (BID) | ORAL | 6 refills | Status: AC
Start: 2024-03-04 — End: ?

## 2024-03-04 NOTE — ED Provider Notes (Signed)
 [facility] Provider Note    Event Date/Time   First MD Initiated Contact with Patient 03/04/24 1408     (approximate)   History   Leg Swelling   HPI  Todd Williamson is a 73 y.o. male history of TIA, hypertension, CVA, dementia and who is currently on Plavix presents emergency department with swelling of the right leg from the knee to the ankle for 2 days.  They did drive to [place] this weekend.  States they were going up and down steps which they do not normally do.  Does not remember an injury.  States the area is painful at first with walking and then will get better.  Denies chest pain/shortness of breath.      Physical Exam   Triage Vital Signs: ED Triage Vitals [03/04/24 1328]  Encounter Vitals Group     BP      Systolic BP Percentile      Diastolic BP Percentile      Pulse      Resp      Temp      Temp src      SpO2      Weight      Height      Head Circumference      Peak Flow      Pain Score 3     Pain Loc      Pain Education      Exclude from Growth Chart     Most recent vital signs: There were no vitals filed for this visit.   General: Awake, no distress.   CV:  Good peripheral perfusion.  Resp:  Normal effort.  Abd:  No distention.   Other:  Right ankle is swollen when compared to the left, right calf is tender and swollen when compared to the left, also feels warm to touch, neurovascular appears to be intact   ED Results / Procedures / Treatments   Labs (all labs ordered are listed, but only abnormal results are displayed) Labs Reviewed - No data to display   EKG     RADIOLOGY Ultrasound right lower leg for DVT    PROCEDURES:   Procedures  Critical Care:   Chief Complaint  Patient presents with   Leg Swelling      MEDICATIONS ORDERED IN ED: Medications - No data to display   IMPRESSION / MDM / ASSESSMENT AND PLAN / ED COURSE  I reviewed the triage vital signs and the nursing notes.                               Differential diagnosis includes, but is not limited to, cellulitis, gout, DVT, muscle strain, sprain  Patient's presentation is most consistent with acute illness / injury with system symptoms.   Ultrasound for DVT  Ultrasound right lower extremity for DVT independently reviewed interpreted by me as being negative for DVT, confirmed by radiology radiology is concerned about a echogenic area which most likely could be a Baker's cyst  I did explain this finding to the patient.  We placed him a Ace wrap, knee immobilizer.  He is to apply ice.  Elevate and ice.  Follow-up with orthopedics for evaluation for Baker's cyst.  Return emergency department worsening.  However due to the amount of swelling if he continues to have swelling in the entire leg next week I do feel that he should have a  repeat ultrasound in 7 days.  Patient is in agreement treatment plan.  Discharged stable condition.      FINAL CLINICAL IMPRESSION(S) / ED DIAGNOSES   Final diagnoses:  Synovial cyst of popliteal space, unspecified laterality     Rx / DC Orders   ED Discharge Orders          Ordered    diclofenac (VOLTAREN) 75 MG EC tablet  2 times daily        03/04/24 1541             Note:  This document was prepared using Dragon voice recognition software and may include unintentional dictation errors.    Delsie Figures, PA-C 03/04/24 1732    Shane Darling, MD 03/04/24 [phone]

## 2024-03-04 NOTE — Discharge Instructions (Signed)
 Follow-up with orthopedics.  Apply ice to the back of the leg.  Use the Voltaren gel.  Apply that to the back of the knee to help with swelling and inflammation. Take Tylenol  for pain as needed. Return emergency department if worsening swelling or pain in the leg.  You may need to have a repeat ultrasound in 1 week.

## 2024-03-04 NOTE — ED Triage Notes (Signed)
 Pt is here for right leg swelling from knee to ankle x2 days.  Not associated with any trauma, he drove back from [place] Sunday.

## 2024-04-21 DIAGNOSIS — Z800 Family history of malignant neoplasm of digestive organs: Secondary | ICD-10-CM | POA: Diagnosis not present

## 2024-04-21 DIAGNOSIS — Z7901 Long term (current) use of anticoagulants: Secondary | ICD-10-CM | POA: Diagnosis not present

## 2024-04-21 DIAGNOSIS — Z01818 Encounter for other preprocedural examination: Secondary | ICD-10-CM | POA: Diagnosis not present

## 2024-07-04 ENCOUNTER — Ambulatory Visit
Admission: RE | Admit: 2024-07-04 | Discharge: 2024-07-04 | Disposition: A | Discharge status: Home / Self Care | Attending: Gastroenterology | Admitting: Gastroenterology

## 2024-07-04 ENCOUNTER — Encounter
Admission: RE | Disposition: A | Discharge status: Home / Self Care | Payer: Self-pay | Source: Home / Self Care | Attending: Gastroenterology

## 2024-07-04 ENCOUNTER — Encounter: Payer: Self-pay | Admitting: Gastroenterology

## 2024-07-04 ENCOUNTER — Ambulatory Visit: Admitting: Anesthesiology

## 2024-07-04 DIAGNOSIS — D122 Benign neoplasm of ascending colon: Secondary | ICD-10-CM | POA: Insufficient documentation

## 2024-07-04 DIAGNOSIS — Z8673 Personal history of transient ischemic attack (TIA), and cerebral infarction without residual deficits: Secondary | ICD-10-CM | POA: Insufficient documentation

## 2024-07-04 DIAGNOSIS — Z1211 Encounter for screening for malignant neoplasm of colon: Secondary | ICD-10-CM | POA: Insufficient documentation

## 2024-07-04 DIAGNOSIS — K635 Polyp of colon: Secondary | ICD-10-CM | POA: Diagnosis not present

## 2024-07-04 DIAGNOSIS — Z79899 Other long term (current) drug therapy: Secondary | ICD-10-CM | POA: Diagnosis not present

## 2024-07-04 DIAGNOSIS — K640 First degree hemorrhoids: Secondary | ICD-10-CM | POA: Insufficient documentation

## 2024-07-04 DIAGNOSIS — I509 Heart failure, unspecified: Secondary | ICD-10-CM | POA: Diagnosis not present

## 2024-07-04 DIAGNOSIS — Z800 Family history of malignant neoplasm of digestive organs: Secondary | ICD-10-CM | POA: Insufficient documentation

## 2024-07-04 DIAGNOSIS — I071 Rheumatic tricuspid insufficiency: Secondary | ICD-10-CM | POA: Diagnosis not present

## 2024-07-04 DIAGNOSIS — K649 Unspecified hemorrhoids: Secondary | ICD-10-CM | POA: Diagnosis not present

## 2024-07-04 DIAGNOSIS — E785 Hyperlipidemia, unspecified: Secondary | ICD-10-CM | POA: Insufficient documentation

## 2024-07-04 DIAGNOSIS — I110 Hypertensive heart disease with heart failure: Secondary | ICD-10-CM | POA: Diagnosis not present

## 2024-07-04 DIAGNOSIS — Z7902 Long term (current) use of antithrombotics/antiplatelets: Secondary | ICD-10-CM | POA: Diagnosis not present

## 2024-07-04 DIAGNOSIS — F0390 Unspecified dementia without behavioral disturbance: Secondary | ICD-10-CM | POA: Insufficient documentation

## 2024-07-04 DIAGNOSIS — K219 Gastro-esophageal reflux disease without esophagitis: Secondary | ICD-10-CM | POA: Diagnosis not present

## 2024-07-04 HISTORY — PX: COLONOSCOPY: SHX5424

## 2024-07-04 HISTORY — PX: POLYPECTOMY: SHX149

## 2024-07-04 SURGERY — COLONOSCOPY
Anesthesia: General

## 2024-07-04 MED ORDER — PROPOFOL 500 MG/50ML IV EMUL
INTRAVENOUS | Status: DC | PRN
  Administered 2024-07-04: 75 ug/kg/min via INTRAVENOUS

## 2024-07-04 MED ORDER — EPHEDRINE SULFATE-NACL 50-0.9 MG/10ML-% IV SOSY
PREFILLED_SYRINGE | INTRAVENOUS | Status: DC | PRN
  Administered 2024-07-04: 10 mg via INTRAVENOUS

## 2024-07-04 MED ORDER — SODIUM CHLORIDE 0.9 % IV SOLN: INTRAVENOUS | Status: DC

## 2024-07-04 MED ORDER — PROPOFOL 10 MG/ML IV BOLUS
INTRAVENOUS | Status: DC | PRN
  Administered 2024-07-04 (×2): 50 mg via INTRAVENOUS

## 2024-07-04 MED ORDER — LIDOCAINE HCL (PF) 2 % IJ SOLN
INTRAMUSCULAR | Status: AC
  Filled 2024-07-04: qty 5

## 2024-07-04 MED ORDER — LIDOCAINE HCL (CARDIAC) PF 100 MG/5ML IV SOSY
PREFILLED_SYRINGE | INTRAVENOUS | Status: DC | PRN
  Administered 2024-07-04: 80 mg via INTRAVENOUS

## 2024-07-04 NOTE — Transfer of Care (Signed)
 Immediate Anesthesia Transfer of Care Note  Patient: Todd Williamson  Procedure(s) Performed: COLONOSCOPY POLYPECTOMY, INTESTINE  Patient Location: PACU  Anesthesia Type:General  Level of Consciousness: sedated  Airway & Oxygen Therapy: Patient Spontanous Breathing  Post-op Assessment: Report given to RN and Post -op Vital signs reviewed and stable  Post vital signs: Reviewed and stable  Last Vitals:  Vitals Value Taken Time  BP 95/66 07/04/24 10:34  Temp 35.4 C 07/04/24 10:34  Pulse 60 07/04/24 10:34  Resp 16 07/04/24 10:34  SpO2 95 % 07/04/24 10:34    Last Pain:  Vitals:   07/04/24 1034  TempSrc: Tympanic  PainSc: Asleep         Complications: No notable events documented.

## 2024-07-04 NOTE — Interval H&P Note (Signed)
 History and Physical Interval Note:  07/04/2024 10:08 AM  Todd Williamson  has presented today for surgery, with the diagnosis of FAM HX.  The various methods of treatment have been discussed with the patient and family. After consideration of risks, benefits and other options for treatment, the patient has consented to  Procedure(s): COLONOSCOPY (N/A) as a surgical intervention.  The patient's history has been reviewed, patient examined, no change in status, stable for surgery.  I have reviewed the patient's chart and labs.  Questions were answered to the patient's satisfaction.     Todd Williamson  Ok to proceed with colonoscopy

## 2024-07-04 NOTE — Op Note (Signed)
 [facility] Patient Name: Todd Williamson Procedure Date: 07/04/2024 10:03 AM MRN: [id number] Account #: [id number] Date of Birth: January 08, 1951 Admit Type: Outpatient Age: 73 Room: [facility] ENDO ROOM 3 Gender: Male Note Status: Finalized Instrument Name: Colon Scope [id number] Procedure:             Colonoscopy Indications:           Screening patient at increased risk: Family history of                         1st-degree relative with colorectal cancer at age 48                         years (or older) Providers:             Ole Schick MD, MD Referring MD:          Layman ORN. Lenon MD, MD (Referring MD) Medicines:             Monitored Anesthesia Care Complications:         No immediate complications. Estimated blood loss:                         Minimal. Procedure:             Pre-Anesthesia Assessment:                        - Prior to the procedure, a History and Physical was                         performed, and patient medications and allergies were                         reviewed. The patient is competent. The risks and                         benefits of the procedure and the sedation options and                         risks were discussed with the patient. All questions                         were answered and informed consent was obtained.                         Patient identification and proposed procedure were                         verified by the physician, the nurse, the                         anesthesiologist, the anesthetist and the technician                         in the endoscopy suite. Mental Status Examination:                         alert and oriented. Airway Examination: normal  oropharyngeal airway and neck mobility. Respiratory                         Examination: clear to auscultation. CV Examination:                         normal. Prophylactic Antibiotics: The patient does not                          require prophylactic antibiotics. Prior                         Anticoagulants: The patient has taken Plavix                         (clopidogrel), last dose was 6 days prior to                         procedure. ASA Grade Assessment: III - A patient with                         severe systemic disease. After reviewing the risks and                         benefits, the patient was deemed in satisfactory                         condition to undergo the procedure. The anesthesia                         plan was to use monitored anesthesia care (MAC).                         Immediately prior to administration of medications,                         the patient was re-assessed for adequacy to receive                         sedatives. The heart rate, respiratory rate, oxygen                         saturations, blood pressure, adequacy of pulmonary                         ventilation, and response to care were monitored                         throughout the procedure. The physical status of the                         patient was re-assessed after the procedure.                        After obtaining informed consent, the colonoscope was                         passed under direct vision. Throughout the procedure,  the patient's blood pressure, pulse, and oxygen                         saturations were monitored continuously. The                         Colonoscope was introduced through the anus and                         advanced to the the cecum, identified by appendiceal                         orifice and ileocecal valve. The colonoscopy was                         performed without difficulty. The patient tolerated                         the procedure well. The quality of the bowel                         preparation was good. The ileocecal valve, appendiceal                         orifice, and rectum were photographed. Findings:      The perianal and  digital rectal examinations were normal.      Three sessile polyps were found in the ascending colon. The polyps were       2 to 7 mm in size. These polyps were removed with a cold snare.       Resection and retrieval were complete. Estimated blood loss was minimal.      Internal hemorrhoids were found during retroflexion. The hemorrhoids       were Grade I (internal hemorrhoids that do not prolapse).      The exam was otherwise without abnormality on direct and retroflexion       views. Impression:            - Three 2 to 7 mm polyps in the ascending colon,                         removed with a cold snare. Resected and retrieved.                        - Internal hemorrhoids.                        - The examination was otherwise normal on direct and                         retroflexion views. Recommendation:        - Discharge patient to home.                        - Resume previous diet.                        - Continue present medications.                        -  Await pathology results.                        - Repeat colonoscopy is not recommended due to current                         age (1 years or older) for surveillance.                        - Return to referring physician as previously                         scheduled.                        - Resume Plavix (clopidogrel) at prior dose tomorrow. Procedure Code(s):     --- Professional ---                        [phone], Colonoscopy, flexible; with removal of                         tumor(s), polyp(s), or other lesion(s) by snare                         technique Diagnosis Code(s):     --- Professional ---                        Z80.0, Family history of malignant neoplasm of                         digestive organs                        D12.2, Benign neoplasm of ascending colon                        K64.0, First degree hemorrhoids CPT copyright 2022 American [employer] Association. All rights reserved. The codes  documented in this report are preliminary and upon coder review may  be revised to meet current compliance requirements. Ole Schick MD, MD 07/04/2024 10:38:00 AM Number of Addenda: 0 Note Initiated On: 07/04/2024 10:03 AM Scope Withdrawal Time: 0 hours 10 minutes 27 seconds  Total Procedure Duration: 0 hours 14 minutes 45 seconds  Estimated Blood Loss:  Estimated blood loss was minimal.      [facility]

## 2024-07-04 NOTE — Anesthesia Postprocedure Evaluation (Signed)
 Anesthesia Post Note  Patient: Todd Williamson  Procedure(s) Performed: COLONOSCOPY POLYPECTOMY, INTESTINE  Patient location during evaluation: Endoscopy Anesthesia Type: General Level of consciousness: awake and alert Pain management: pain level controlled Vital Signs Assessment: post-procedure vital signs reviewed and stable Respiratory status: spontaneous breathing, nonlabored ventilation, respiratory function stable and patient connected to nasal cannula oxygen Cardiovascular status: blood pressure returned to baseline and stable Postop Assessment: no apparent nausea or vomiting Anesthetic complications: no   No notable events documented.   Last Vitals:  Vitals:   07/04/24 1042 07/04/24 1052  BP: 103/67 105/69  Pulse: (!) 59 (!) 56  Resp: 16 16  Temp:    SpO2: 100% 100%    Last Pain:  Vitals:   07/04/24 1052  TempSrc:   PainSc: 0-No pain                 Todd Williamson [name]

## 2024-07-04 NOTE — H&P (Signed)
 Outpatient short stay form Pre-procedure 07/04/2024  Todd ONEIDA Schick, MD  Primary Physician: Lenon Layman ORN, MD  Reason for visit:  Screening  History of present illness:    73 y/o gentleman with history of CVA on plavix with las dose 6 days ago, hypertension, and hyperlipidemia here for screening colonoscopy. Last colonoscopy in 2015 was normal. Brother had colon cancer in his 53's. Denies any abdominal surgeries.    Current Facility-Administered Medications:    0.9 %  sodium chloride  infusion, , Intravenous, Continuous, Jennaya Pogue, Todd ONEIDA, MD, Last Rate: 20 mL/hr at 07/04/24 0945, Continued from Pre-op at 07/04/24 0945  Medications Prior to Admission  Medication Sig Dispense Refill Last Dose/Taking   amLODipine-benazepril (LOTREL) 10-20 MG capsule Take 1 capsule by mouth daily.    07/04/2024 at  6:00 AM   atorvastatin (LIPITOR) 40 MG tablet Take 40 mg by mouth daily.      Cinnamon 500 MG TABS Take 1,000 mg by mouth once.      clopidogrel (PLAVIX) 75 MG tablet Take 75 mg by mouth daily.   06/28/2024   Cyanocobalamin  (VITAMIN B 12) 500 MCG TABS Take 2 tablets by mouth once.      diclofenac  (VOLTAREN ) 75 MG EC tablet Take 1 tablet (75 mg total) by mouth 2 (two) times daily. 30 tablet 6    fluticasone (FLONASE) 50 MCG/ACT nasal spray Place 1 spray into both nostrils daily as needed for allergies or rhinitis.      loratadine (CLARITIN) 10 MG tablet Take 10 mg by mouth daily.      meclizine  (ANTIVERT ) 12.5 MG tablet Take 1 tablet (12.5 mg total) by mouth 3 (three) times daily as needed for dizziness or nausea. 30 tablet 1    omeprazole (PRILOSEC) 20 MG capsule Take 20 mg by mouth daily.      ondansetron  (ZOFRAN -ODT) 4 MG disintegrating tablet Take 1 tablet (4 mg total) by mouth every 6 (six) hours as needed for nausea or vomiting. (Patient not taking: Reported on 07/31/2023) 20 tablet 0      Allergies  Allergen Reactions   Hydrochlorothiazide Nausea And Vomiting    Peanut-Containing Drug Products Other (See Comments)    Tingling and welps   Shellfish Allergy Other (See Comments)    From allergy test     Past Medical History:  Diagnosis Date   Dementia (HCC)    GERD (gastroesophageal reflux disease)    Grade I diastolic dysfunction    Hyperlipidemia    Hypertension    Mild tricuspid regurgitation by prior echocardiogram    Stroke [facility])    TIA (transient ischemic attack) 2018    Review of systems:  Otherwise negative.    Physical Exam  Gen: Alert, oriented. Appears stated age.  HEENT: PERRLA. Lungs: No respiratory distress CV: RRR Abd: soft, benign, no masses Ext: No edema    Planned procedures: Proceed with colonoscopy. The patient understands the nature of the planned procedure, indications, risks, alternatives and potential complications including but not limited to bleeding, infection, perforation, damage to internal organs and possible oversedation/side effects from anesthesia. The patient agrees and gives consent to proceed.  Please refer to procedure notes for findings, recommendations and patient disposition/instructions.     Todd ONEIDA Schick, MD [facility]

## 2024-07-04 NOTE — Anesthesia Preprocedure Evaluation (Signed)
 Anesthesia Evaluation  Patient identified by MRN, date of birth, ID band Patient awake    Reviewed: Allergy & Precautions, NPO status , Patient's Chart, lab work & pertinent test results  History of Anesthesia Complications Negative for: history of anesthetic complications  Airway Mallampati: III  TM Distance: >3 FB Neck ROM: full    Dental  (+) Chipped   Pulmonary neg pulmonary ROS   Pulmonary exam normal        Cardiovascular hypertension, On Medications +CHF  Normal cardiovascular exam+ Valvular Problems/Murmurs (TR)      Neuro/Psych  PSYCHIATRIC DISORDERS     Dementia TIACVA    GI/Hepatic Neg liver ROS,GERD  Medicated,,  Endo/Other  negative endocrine ROS    Renal/GU negative Renal ROS  negative genitourinary   Musculoskeletal   Abdominal   Peds  Hematology negative hematology ROS (+)   Anesthesia Other Findings Past Medical History: No date: Dementia (HCC) No date: GERD (gastroesophageal reflux disease) No date: Grade I diastolic dysfunction No date: Hyperlipidemia No date: Hypertension No date: Mild tricuspid regurgitation by prior echocardiogram No date: Stroke [facility]) 2018: TIA (transient ischemic attack)  Past Surgical History: 08/10/2023: CATARACT EXTRACTION W/PHACO; Left     Comment:  Procedure: CATARACT EXTRACTION PHACO AND INTRAOCULAR               LENS PLACEMENT (IOC) LEFT;  Surgeon: Myrna Adine Anes,               MD;  Location: [facility];  Service:               Ophthalmology;  Laterality: Left;  8.59 0:55.6 No date: COLONOSCOPY 1982: RETINAL DETACHMENT SURGERY; Right 12/21/2018: ROBOT ASSISTED INGUINAL HERNIA REPAIR; Right     Comment:  Procedure: ROBOT ASSISTED INGUINAL HERNIA REPAIR;                Surgeon: Jordis Laneta FALCON, MD;  Location: [facility];                Service: General;  Laterality: Right; 12/21/2018: UMBILICAL HERNIA REPAIR; N/A     Comment:  Procedure:  LAPAROSCOPIC UMBILICAL HERNIA - ROBOTIC;                Surgeon: Jordis Laneta FALCON, MD;  Location: [facility];                Service: General;  Laterality: N/A;     Reproductive/Obstetrics negative OB ROS                              Anesthesia Physical Anesthesia Plan  ASA: 3  Anesthesia Plan: General   Post-op Pain Management:    Induction: Intravenous  PONV Risk Score and Plan: Propofol  infusion and TIVA  Airway Management Planned: Natural Airway and Nasal Cannula  Additional Equipment:   Intra-op Plan:   Post-operative Plan:   Informed Consent: I have reviewed the patients History and Physical, chart, labs and discussed the procedure including the risks, benefits and alternatives for the proposed anesthesia with the patient or authorized representative who has indicated his/her understanding and acceptance.     Dental Advisory Given  Plan Discussed with: Anesthesiologist, CRNA and Surgeon  Anesthesia Plan Comments: (Patient consented for risks of anesthesia including but not limited to:  - adverse reactions to medications - risk of airway placement if required - damage to eyes, teeth, lips or other oral mucosa - nerve damage due  to positioning  - sore throat or hoarseness - Damage to heart, brain, nerves, lungs, other parts of body or loss of life  Patient voiced understanding and assent.)         Anesthesia Quick Evaluation

## 2024-07-05 LAB — SURGICAL PATHOLOGY

## 2024-07-06 DIAGNOSIS — E538 Deficiency of other specified B group vitamins: Secondary | ICD-10-CM | POA: Diagnosis not present

## 2024-07-06 DIAGNOSIS — R7303 Prediabetes: Secondary | ICD-10-CM | POA: Diagnosis not present

## 2024-07-06 DIAGNOSIS — I10 Essential (primary) hypertension: Secondary | ICD-10-CM | POA: Diagnosis not present

## 2024-07-06 DIAGNOSIS — I779 Disorder of arteries and arterioles, unspecified: Secondary | ICD-10-CM | POA: Diagnosis not present

## 2024-07-13 DIAGNOSIS — F0390 Unspecified dementia without behavioral disturbance: Secondary | ICD-10-CM | POA: Diagnosis not present

## 2024-07-13 DIAGNOSIS — Z1331 Encounter for screening for depression: Secondary | ICD-10-CM | POA: Diagnosis not present

## 2024-07-13 DIAGNOSIS — R7303 Prediabetes: Secondary | ICD-10-CM | POA: Diagnosis not present

## 2024-07-13 DIAGNOSIS — E538 Deficiency of other specified B group vitamins: Secondary | ICD-10-CM | POA: Diagnosis not present

## 2024-07-13 DIAGNOSIS — I10 Essential (primary) hypertension: Secondary | ICD-10-CM | POA: Diagnosis not present

## 2024-07-13 DIAGNOSIS — Z0000 Encounter for general adult medical examination without abnormal findings: Secondary | ICD-10-CM | POA: Diagnosis not present
